# Patient Record
Sex: Male | Born: 1975 | Race: White | Hispanic: No | Marital: Single | Smoking: Current every day smoker
Health system: Southern US, Community
[De-identification: ages and names within clinical notes are randomized; demographics above are authoritative.]

## PROBLEM LIST (undated history)

## (undated) DIAGNOSIS — T7840XA Allergy, unspecified, initial encounter: Secondary | ICD-10-CM

## (undated) HISTORY — PX: SHOULDER ARTHROSCOPY: SHX128

## (undated) HISTORY — DX: Allergy, unspecified, initial encounter: T78.40XA

## (undated) HISTORY — PX: BACK SURGERY: SHX140

## (undated) HISTORY — PX: KNEE SURGERY: SHX244

---

## 2006-03-02 ENCOUNTER — Ambulatory Visit: Payer: Self-pay

## 2007-07-11 ENCOUNTER — Ambulatory Visit: Payer: Self-pay | Admitting: Internal Medicine

## 2007-09-12 ENCOUNTER — Ambulatory Visit: Payer: Self-pay | Admitting: Internal Medicine

## 2008-05-19 ENCOUNTER — Ambulatory Visit: Payer: Self-pay | Admitting: Internal Medicine

## 2009-08-03 ENCOUNTER — Ambulatory Visit: Payer: Self-pay | Admitting: Unknown Physician Specialty

## 2010-03-29 ENCOUNTER — Ambulatory Visit: Payer: Self-pay

## 2010-05-05 ENCOUNTER — Ambulatory Visit: Payer: Self-pay | Admitting: Unknown Physician Specialty

## 2011-08-09 ENCOUNTER — Ambulatory Visit: Payer: Self-pay | Admitting: Unknown Physician Specialty

## 2013-02-20 ENCOUNTER — Ambulatory Visit: Payer: Self-pay | Admitting: Unknown Physician Specialty

## 2013-04-17 ENCOUNTER — Emergency Department: Payer: Self-pay

## 2014-08-28 DIAGNOSIS — G8929 Other chronic pain: Secondary | ICD-10-CM | POA: Insufficient documentation

## 2014-09-17 ENCOUNTER — Emergency Department: Payer: Self-pay | Admitting: Emergency Medicine

## 2014-09-28 ENCOUNTER — Emergency Department: Payer: Self-pay | Admitting: Internal Medicine

## 2015-02-12 IMAGING — CR LEFT INDEX FINGER 2+V
1 series · 3 of 3 positions shown · non-contrast
Comparison: None.

CLINICAL DATA: Initial valuation for acute trauma.

EXAM:
LEFT INDEX FINGER 2+V

[Series 1: dxr finger index 2nd digit lt ha · 0.14mm/px · 3 of 3 slices shown]
[im 1/3]
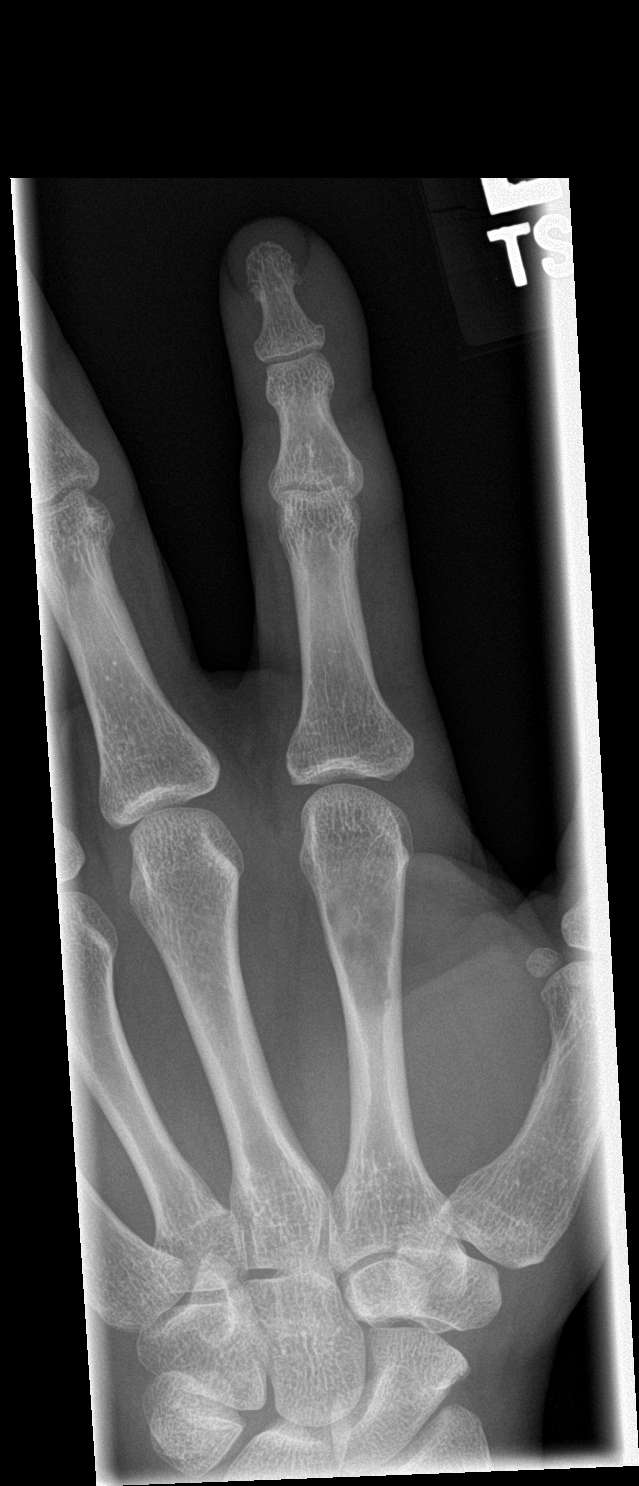
[im 2/3]
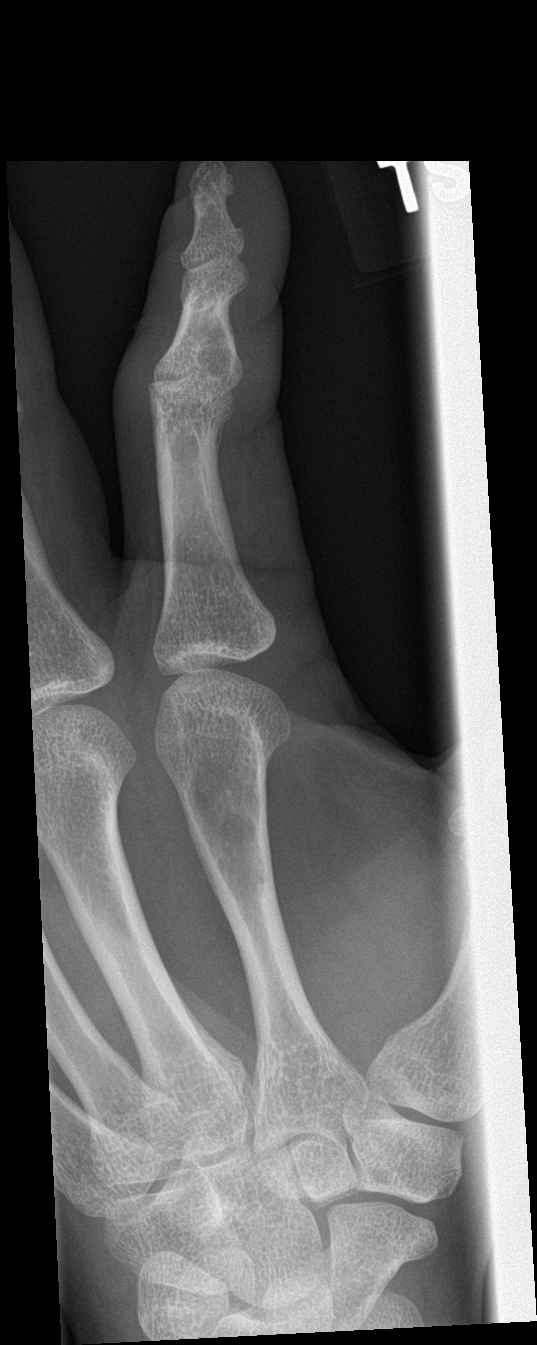
[im 3/3]
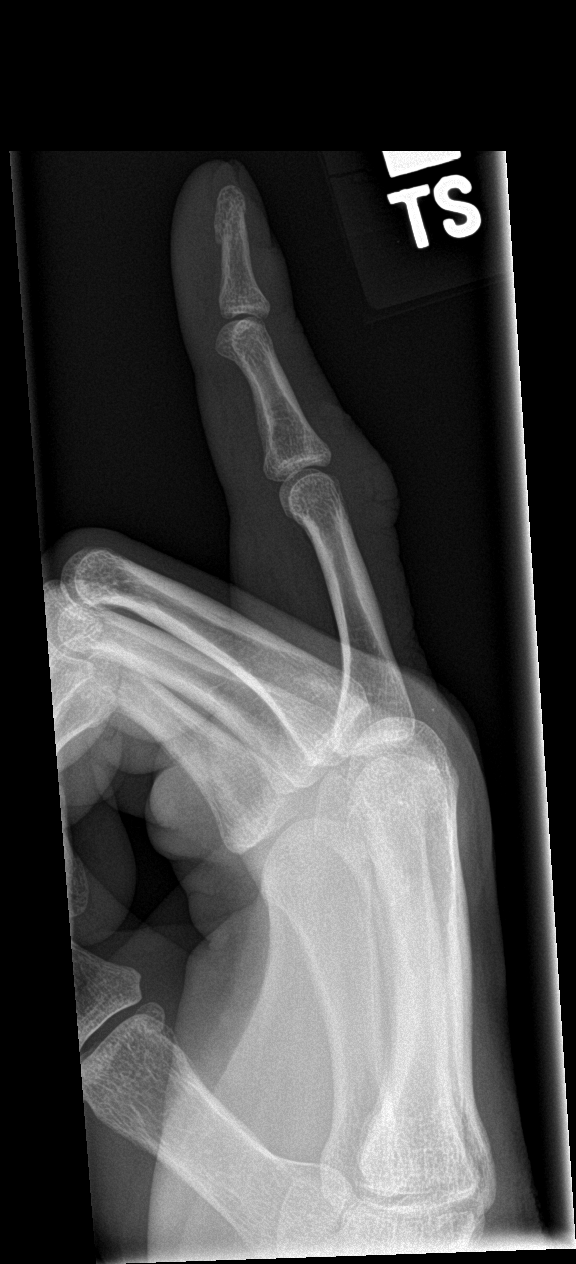

[3 of 3 positions shown; findings below may reference images not displayed]

FINDINGS: No acute fracture or dislocation. Joint spaces are maintained. No
evidence of significant degenerative or erosive arthropathy. Focal
soft tissue irregularity at the dorsal aspect of the mid digit bases
S of of laceration. No radiopaque foreign body. No dissecting soft
tissue emphysema.
IMPRESSION: 1. Soft tissue irregularity at the mid aspect of the left second
digit, suspicious for laceration. No radiopaque foreign body.
2. No acute fracture or dislocation.

## 2015-04-21 DIAGNOSIS — M19111 Post-traumatic osteoarthritis, right shoulder: Secondary | ICD-10-CM | POA: Insufficient documentation

## 2016-03-19 ENCOUNTER — Encounter: Payer: Self-pay | Admitting: Urgent Care

## 2016-03-19 DIAGNOSIS — S61411A Laceration without foreign body of right hand, initial encounter: Secondary | ICD-10-CM | POA: Insufficient documentation

## 2016-03-19 DIAGNOSIS — F17200 Nicotine dependence, unspecified, uncomplicated: Secondary | ICD-10-CM | POA: Insufficient documentation

## 2016-03-19 DIAGNOSIS — Y999 Unspecified external cause status: Secondary | ICD-10-CM | POA: Insufficient documentation

## 2016-03-19 DIAGNOSIS — Y929 Unspecified place or not applicable: Secondary | ICD-10-CM | POA: Insufficient documentation

## 2016-03-19 DIAGNOSIS — Y939 Activity, unspecified: Secondary | ICD-10-CM | POA: Insufficient documentation

## 2016-03-19 DIAGNOSIS — W25XXXA Contact with sharp glass, initial encounter: Secondary | ICD-10-CM | POA: Insufficient documentation

## 2016-03-19 NOTE — ED Notes (Signed)
Patient presents with laceration in between 3rd and 4th digit on RIGHT hand; knuckle area. Patient reports that he cut hand on an "old window"; states, "The glass just went through the glove". Bleeding controlled. Wound re-dressed in triage.

## 2016-03-20 ENCOUNTER — Emergency Department
Admission: EM | Admit: 2016-03-20 | Discharge: 2016-03-20 | Disposition: A | Discharge status: Home / Self Care | Payer: Self-pay | Attending: Emergency Medicine | Admitting: Emergency Medicine

## 2016-03-20 DIAGNOSIS — IMO0002 Reserved for concepts with insufficient information to code with codable children: Secondary | ICD-10-CM

## 2016-03-20 NOTE — ED Notes (Signed)
Patient states that he cut his hand on a broken window. Patient with small laceration between third and fourth finger of right hand. Bleeding controlled at this time.

## 2016-03-20 NOTE — ED Provider Notes (Signed)
[facility] Emergency Department Provider Note    ____________________________________________  Time seen: ~0130  I have reviewed the triage vital signs and the nursing notes.   HISTORY  Chief Complaint Laceration   History limited by: Not Limited   HPI Ruben Turner is a 40 y.o. male who presents to the emergency department today because of concerns for laceration. The patient states that he was cleaning and attic out. Part of this was moving old windows. He states he went to knock some glass out and a piece of glass cut his right hand. He was wearing a glove at the time. He denies any other injuries. He states his last tetanus shot was within the past 5 years.   History reviewed. No pertinent past medical history.  There are no active problems to display for this patient.   Past Surgical History  Procedure Laterality Date  . Knee surgery Left   . Shoulder arthroscopy Right     No current outpatient prescriptions on file.  Allergies Erythromycin and Penicillins  No family history on file.  Social History Social History  Substance Use Topics  . Smoking status: Current Every Day Smoker  . Smokeless tobacco: None  . Alcohol Use: No    Review of Systems  Constitutional: Negative for fever. Cardiovascular: Negative for chest pain. Respiratory: Negative for shortness of breath. Gastrointestinal: Negative for abdominal pain, vomiting and diarrhea. Skin: Positive for laceration Neurological: Negative for headaches, focal weakness or numbness.  10-point ROS otherwise negative.  ____________________________________________   PHYSICAL EXAM:  VITAL SIGNS: ED Triage Vitals  Enc Vitals Group     BP 03/19/16 2347 133/88 mmHg     Pulse Rate 03/19/16 2347 79     Resp 03/19/16 2347 18     Temp 03/19/16 2347 98.7 F (37.1 C)     Temp Source 03/19/16 2347 Oral     SpO2 03/19/16 2347 99 %     Weight --      Height --      Head  Cir --      Peak Flow --      Pain Score 03/19/16 2348 5   Constitutional: Alert and oriented. Well appearing and in no distress. Eyes: Conjunctivae are normal. PERRL. Normal extraocular movements. ENT   Head: Normocephalic and atraumatic.   Nose: No congestion/rhinnorhea.   Mouth/Throat: Mucous membranes are moist.   Neck: No stridor. Cardiovascular: Normal rate, regular rhythm.  No murmurs, rubs, or gallops. Respiratory: Normal respiratory effort without tachypnea nor retractions. Breath sounds are clear and equal bilaterally. No wheezes/rales/rhonchi. Gastrointestinal: Soft and nontender. No distention.  Genitourinary: Deferred Musculoskeletal: Normal range of motion in all extremities. No joint effusions.  Neurologic:  Normal speech and language. No gross focal neurologic deficits are appreciated.  Skin:  Roughly 1 cm laceration between the 3rd and 4th digit knuckles, hemostatic. No foreign bodies.  Psychiatric: Mood and affect are normal. Speech and behavior are normal. Patient exhibits appropriate insight and judgment.  ____________________________________________    LABS (pertinent positives/negatives)  None  ____________________________________________   EKG  None  ____________________________________________    RADIOLOGY  None  ____________________________________________   PROCEDURES  Procedure(s) performed: Laceration repair, see procedure note(s).  Critical Care performed: No  LACERATION REPAIR Performed by: Mable Fill, PA student Authorized and supervized by: Phineas Semen Consent: Verbal consent obtained. Risks and benefits: risks, benefits and alternatives were discussed Consent given by: patient Patient identity confirmed: provided demographic data Prepped and Draped in normal sterile  fashion Wound explored  Laceration Location: right hand  Laceration Length: 1 cm  No Foreign Bodies seen or palpated  Irrigation  method: syringe Amount of cleaning: standard  Skin closure: dermabond  Patient tolerance: Patient tolerated the procedure well with no immediate complications.  ____________________________________________   INITIAL IMPRESSION / ASSESSMENT AND PLAN / ED COURSE  Pertinent labs & imaging results that were available during my care of the patient were reviewed by me and considered in my medical decision making (see chart for details).  Patient presented to the emergency department today because of concern for a laceration to his right hand. On exam he had a roughly 1 cm laceration that was hemostatic. The laceration was closed with Dermabond. Patient's tetanus is up-to-date.  ____________________________________________   FINAL CLINICAL IMPRESSION(S) / ED DIAGNOSES  Final diagnoses:  Laceration     Phineas SemenGraydon Carmeline Kowal, MD 03/20/16 [phone]

## 2016-03-20 NOTE — Discharge Instructions (Signed)
Please seek medical attention for any high fevers, chest pain, shortness of breath, change in behavior, persistent vomiting, bloody stool or any other new or concerning symptoms.  Tissue Adhesive Wound Care Some cuts and wounds can be closed with tissue adhesive. Adhesive is like glue. It holds the skin together and helps a wound heal faster. This adhesive goes away on its own as the wound heals.  HOME CARE   Showers are allowed. Do not soak the wound in water. Do not take baths, swim, or use hot tubs. Do not use soaps or creams on your wound.  If a bandage (dressing) was put on, change it as often as told by your doctor.  Keep the bandage dry.  Do not scratch, pick, or rub the adhesive.  Do not put tape over the adhesive. The adhesive could come off.  Protect the wound from another injury.  Protect the wound from sun and tanning beds.  Only take medicine as told by your doctor.  Keep all doctor visits as told. GET HELP RIGHT AWAY IF:   Your wound is red, puffy (swollen), hot, or tender.  You get a rash after the glue is put on.  You have more pain in the wound.  You have a red streak going away from the wound.  You have yellowish-white fluid (pus) coming from the wound.  You have more bleeding.  You have a fever.  You have chills and start to shake.  You notice a bad smell coming from the wound.  Your wound or adhesive breaks open. MAKE SURE YOU:   Understand these instructions.  Will watch your condition.  Will get help right away if you are not doing well or get worse.   This information is not intended to replace advice given to you by your health care provider. Make sure you discuss any questions you have with your health care provider.   Document Released: 08/30/2008 Document Revised: 09/11/2013 Document Reviewed: 06/12/2013 Elsevier Interactive Patient Education [employer]2016 [employer]vier Inc.

## 2018-03-20 ENCOUNTER — Other Ambulatory Visit: Payer: Self-pay

## 2018-03-20 ENCOUNTER — Encounter: Payer: Self-pay | Admitting: Emergency Medicine

## 2018-03-20 ENCOUNTER — Ambulatory Visit
Admission: EM | Admit: 2018-03-20 | Discharge: 2018-03-20 | Disposition: A | Discharge status: Home / Self Care | Payer: Self-pay | Attending: Family Medicine | Admitting: Family Medicine

## 2018-03-20 DIAGNOSIS — L02419 Cutaneous abscess of limb, unspecified: Secondary | ICD-10-CM

## 2018-03-20 DIAGNOSIS — L02411 Cutaneous abscess of right axilla: Secondary | ICD-10-CM

## 2018-03-20 MED ORDER — SULFAMETHOXAZOLE-TRIMETHOPRIM 800-160 MG PO TABS: 1.0000 | ORAL_TABLET | Freq: Two times a day (BID) | ORAL | 0 refills | Status: DC

## 2018-03-20 NOTE — ED Triage Notes (Signed)
Patient states that he has an insect bite in right arm pit a week ago.  Patient c/o swelling redness and pain at the site.  Patient denies fevers.

## 2018-03-20 NOTE — ED Provider Notes (Signed)
[facility]    CSN: [id number] Arrival date & time: 03/20/18  1528     History   Chief Complaint Chief Complaint  Patient presents with  . Insect Bite    HPI Ruben Turner is a 42 y.o. male.   42 yo male with a red, tender, warm lump on the skin of his right axilla for one week, progressively worsening. States he thinks it may have started as an insect bite. Denies any drainage, fevers, chills.   The history is provided by the patient.    History reviewed. No pertinent past medical history.  There are no active problems to display for this patient.   Past Surgical History:  Procedure Laterality Date  . KNEE SURGERY Left   . SHOULDER ARTHROSCOPY Right        Home Medications    Prior to Admission medications   Medication Sig Start Date End Date Taking? Authorizing Provider  sulfamethoxazole-trimethoprim (BACTRIM DS,SEPTRA DS) 800-160 MG tablet Take 1 tablet by mouth 2 (two) times daily. 03/20/18   Payton Mccallum, MD    Family History History reviewed. No pertinent family history.  Social History Social History   Tobacco Use  . Smoking status: Current Every Day Smoker    Types: Cigarettes  . Smokeless tobacco: Never Used  Substance Use Topics  . Alcohol use: No  . Drug use: Not on file     Allergies   Erythromycin and Penicillins   Review of Systems Review of Systems   Physical Exam Triage Vital Signs ED Triage Vitals  Enc Vitals Group     BP 03/20/18 1550 116/82     Pulse Rate 03/20/18 1550 76     Resp 03/20/18 1550 16     Temp 03/20/18 1550 98.4 F (36.9 C)     Temp Source 03/20/18 1550 Oral     SpO2 03/20/18 1550 99 %     Weight 03/20/18 1547 170 lb (77.1 kg)     Height 03/20/18 1547 5\' 11"  (1.803 m)     Head Circumference --      Peak Flow --      Pain Score 03/20/18 1547 8     Pain Loc --      Pain Edu? --      Excl. in GC? --    No data found.  Updated Vital Signs BP 116/82 (BP Location: Left Arm)    Pulse 76   Temp 98.4 F (36.9 C) (Oral)   Resp 16   Ht 5\' 11"  (1.803 m)   Wt 170 lb (77.1 kg)   SpO2 99%   BMI 23.71 kg/m   Visual Acuity Right Eye Distance:   Left Eye Distance:   Bilateral Distance:    Right Eye Near:   Left Eye Near:    Bilateral Near:     Physical Exam  Constitutional: He appears well-developed and well-nourished. No distress.  Skin: He is not diaphoretic.  3x4cm fluctuant, tender, erythematous and warm subcutaneous lesion on right axillary skin  Nursing note and vitals reviewed.    UC Treatments / Results  Labs (all labs ordered are listed, but only abnormal results are displayed) Labs Reviewed - No data to display  EKG None Radiology No results found.  Procedures Incision and Drainage Date/Time: 03/20/2018 4:48 PM Performed by: Payton Mccallum, MD Authorized by: Payton Mccallum, MD   Consent:    Consent obtained:  Verbal   Consent given by:  Patient   Risks discussed:  Damage to other organs, incomplete drainage, bleeding, pain and infection   Alternatives discussed:  No treatment Location:    Type:  Abscess   Size:  3x4cm   Location:  Upper extremity   Upper extremity location: right axilla. Pre-procedure details:    Skin preparation:  Betadine Anesthesia (see MAR for exact dosages):    Anesthesia method:  Topical application   Topical anesthetic:  LET Procedure type:    Complexity:  Simple Procedure details:    Needle aspiration: no     Incision types:  Stab incision   Incision depth:  Subcutaneous   Scalpel blade:  11   Drainage:  Purulent   Drainage amount:  Scant   Wound treatment:  Wound left open   Packing materials:  None Post-procedure details:    Patient tolerance of procedure:  Tolerated well, no immediate complications   (including critical care time)  Medications Ordered in UC Medications - No data to display   Initial Impression / Assessment and Plan / UC Course  I have reviewed the triage vital signs and  the nursing notes.  Pertinent labs & imaging results that were available during my care of the patient were reviewed by me and considered in my medical decision making (see chart for details).       Final Clinical Impressions(s) / UC Diagnoses   Final diagnoses:  Axillary abscess    ED Discharge Orders        Ordered    sulfamethoxazole-trimethoprim (BACTRIM DS,SEPTRA DS) 800-160 MG tablet  2 times daily     03/20/18 1638     1. diagnosis reviewed with patient 2. Procedure as per note above 3.  rx as per orders above; reviewed possible side effects, interactions, risks and benefits  4. Recommend supportive treatment with warm compresses to area; otc analgesics prn  5. Follow-up prn if symptoms worsen or don't improve  Controlled Substance Prescriptions [place] Controlled Substance Registry consulted? Not Applicable   Payton Mccallumonty, Flora Ratz, MD 03/20/18 [phone]

## 2018-03-20 NOTE — Discharge Instructions (Signed)
Warm compresses to area °

## 2021-12-30 ENCOUNTER — Encounter: Payer: Self-pay | Admitting: Emergency Medicine

## 2021-12-30 ENCOUNTER — Ambulatory Visit
Admission: EM | Admit: 2021-12-30 | Discharge: 2021-12-30 | Disposition: A | Discharge status: Home / Self Care | Payer: Self-pay | Attending: Internal Medicine | Admitting: Internal Medicine

## 2021-12-30 ENCOUNTER — Other Ambulatory Visit: Payer: Self-pay

## 2021-12-30 DIAGNOSIS — J029 Acute pharyngitis, unspecified: Secondary | ICD-10-CM

## 2021-12-30 DIAGNOSIS — Z20822 Contact with and (suspected) exposure to covid-19: Secondary | ICD-10-CM | POA: Insufficient documentation

## 2021-12-30 DIAGNOSIS — F17210 Nicotine dependence, cigarettes, uncomplicated: Secondary | ICD-10-CM | POA: Insufficient documentation

## 2021-12-30 LAB — GROUP A STREP BY PCR: Group A Strep by PCR: NOT DETECTED

## 2021-12-30 NOTE — ED Provider Notes (Signed)
[facility]    CSN: [id number] Arrival date & time: 12/30/21  1529      History   Chief Complaint Chief Complaint  Patient presents with   Sore Throat    HPI Ruben Turner is a 46 y.o. male.   Patient presents with sore throat for 2 days.  Painful to swallow but able to tolerate food and liquids.  No known sick contacts.  Has not attempted treatment of symptoms.  No pertinent medical history.  Denies fever, chills, body aches, nasal congestion, rhinorrhea, shortness of breath, wheezing, coughing, abdominal pain, nausea, vomiting, diarrhea, ear pain or headaches.  History reviewed. No pertinent past medical history.  There are no problems to display for this patient.   Past Surgical History:  Procedure Laterality Date   KNEE SURGERY Left    SHOULDER ARTHROSCOPY Right        Home Medications    Prior to Admission medications   Medication Sig Start Date End Date Taking? Authorizing Provider  sulfamethoxazole-trimethoprim (BACTRIM DS,SEPTRA DS) 800-160 MG tablet Take 1 tablet by mouth 2 (two) times daily. 03/20/18   Payton Mccallum, MD    Family History No family history on file.  Social History Social History   Tobacco Use   Smoking status: Every Day    Types: Cigarettes   Smokeless tobacco: Never  Vaping Use   Vaping Use: Never used  Substance Use Topics   Alcohol use: No     Allergies   Erythromycin and Penicillins   Review of Systems Review of Systems  Constitutional: Negative.   HENT:  Positive for sore throat. Negative for congestion, dental problem, drooling, ear discharge, ear pain, facial swelling, hearing loss, mouth sores, nosebleeds, postnasal drip, rhinorrhea, sinus pressure, sinus pain, sneezing, tinnitus, trouble swallowing and voice change.   Respiratory: Negative.    Cardiovascular: Negative.   Gastrointestinal: Negative.   Skin: Negative.   Neurological: Negative.     Physical Exam Triage Vital Signs ED Triage  Vitals  Enc Vitals Group     BP 12/30/21 1541 115/67     Pulse Rate 12/30/21 1541 (!) 50     Resp 12/30/21 1541 18     Temp 12/30/21 1541 97.9 F (36.6 C)     Temp Source 12/30/21 1541 Oral     SpO2 12/30/21 1541 100 %     Weight 12/30/21 1539 169 lb 15.6 oz (77.1 kg)     Height 12/30/21 1539 5\' 11"  (1.803 m)     Head Circumference --      Peak Flow --      Pain Score 12/30/21 1538 3     Pain Loc --      Pain Edu? --      Excl. in GC? --    No data found.  Updated Vital Signs BP 115/67 (BP Location: Left Arm)    Pulse (!) 50    Temp 97.9 F (36.6 C) (Oral)    Resp 18    Ht 5\' 11"  (1.803 m)    Wt 169 lb 15.6 oz (77.1 kg)    SpO2 100%    BMI 23.71 kg/m   Visual Acuity Right Eye Distance:   Left Eye Distance:   Bilateral Distance:    Right Eye Near:   Left Eye Near:    Bilateral Near:     Physical Exam Constitutional:      Appearance: He is well-developed and normal weight.  HENT:     Head: Normocephalic.  Right Ear: Tympanic membrane and ear canal normal.     Left Ear: Tympanic membrane and ear canal normal.     Nose: No congestion or rhinorrhea.     Mouth/Throat:     Mouth: Mucous membranes are moist.     Pharynx: Posterior oropharyngeal erythema present.     Tonsils: No tonsillar exudate or tonsillar abscesses. 0 on the right. 0 on the left.  Cardiovascular:     Rate and Rhythm: Normal rate and regular rhythm.     Heart sounds: Normal heart sounds.  Pulmonary:     Effort: Pulmonary effort is normal.     Breath sounds: Normal breath sounds.  Musculoskeletal:     Cervical back: Neck supple.  Skin:    General: Skin is warm and dry.  Neurological:     General: No focal deficit present.     Mental Status: He is alert and oriented to person, place, and time.  Psychiatric:        Mood and Affect: Mood normal.        Behavior: Behavior normal.     UC Treatments / Results  Labs (all labs ordered are listed, but only abnormal results are displayed) Labs  Reviewed  GROUP A STREP BY PCR    EKG   Radiology No results found.  Procedures Procedures (including critical care time)  Medications Ordered in UC Medications - No data to display  Initial Impression / Assessment and Plan / UC Course  I have reviewed the triage vital signs and the nursing notes.  Pertinent labs & imaging results that were available during my care of the patient were reviewed by me and considered in my medical decision making (see chart for details).  Sore throat  Strep PCR negative, COVID test pending, discussed findings with patient, etiology of symptoms most likely viral, recommended over-the-counter Tylenol or ibuprofen for pain management, may attempt salt water gargles, Listerine gargles, throat lozenges, warm liquids and honey in addition for management, urgent care follow-up as needed Final Clinical Impressions(s) / UC Diagnoses   Final diagnoses:  None   Discharge Instructions   None    ED Prescriptions   None    PDMP not reviewed this encounter.   Valinda Hoar, NP 12/30/21 1624

## 2021-12-30 NOTE — ED Triage Notes (Signed)
Pt c/o sore throat. Started about 2 days ago. Denies other URI symptoms or fever.

## 2021-12-30 NOTE — Discharge Instructions (Signed)
Your strep PCR test was negative for bacteria therefore we must treat this as a viral symptom meaning it will work its way through your system with time and resolve on its own  Your COVID test is pending, you will be called if positive  You may attempt use of salt water gargles, gargling Listerine, throat lozenges, increasing fluid intake, warm teas or liquids and honey for comfort  You may use 600 to 800 mg of ibuprofen every 6-8 hours and/or Tylenol 500 to 1000 mg every 6 hours for pain management  If symptoms do not improve you may follow-up with urgent care as needed

## 2021-12-31 LAB — SARS CORONAVIRUS 2 (TAT 6-24 HRS): SARS Coronavirus 2: NEGATIVE

## 2023-12-18 ENCOUNTER — Emergency Department
Admission: EM | Admit: 2023-12-18 | Discharge: 2023-12-18 | Disposition: A | Discharge status: Home / Self Care | Payer: MEDICAID | Attending: Emergency Medicine | Admitting: Emergency Medicine

## 2023-12-18 ENCOUNTER — Other Ambulatory Visit: Payer: Self-pay

## 2023-12-18 DIAGNOSIS — M546 Pain in thoracic spine: Secondary | ICD-10-CM | POA: Insufficient documentation

## 2023-12-18 DIAGNOSIS — M549 Dorsalgia, unspecified: Secondary | ICD-10-CM | POA: Diagnosis present

## 2023-12-18 LAB — URINALYSIS, ROUTINE W REFLEX MICROSCOPIC
Bilirubin Urine: NEGATIVE
Glucose, UA: NEGATIVE mg/dL
Hgb urine dipstick: NEGATIVE
Ketones, ur: NEGATIVE mg/dL
Leukocytes,Ua: NEGATIVE
Nitrite: NEGATIVE
Protein, ur: NEGATIVE mg/dL
Specific Gravity, Urine: 1.014 (ref 1.005–1.030)
pH: 5 (ref 5.0–8.0)

## 2023-12-18 LAB — COMPREHENSIVE METABOLIC PANEL
ALT: 19 U/L (ref 0–44)
AST: 23 U/L (ref 15–41)
Albumin: 3.3 g/dL — ABNORMAL LOW (ref 3.5–5.0)
Alkaline Phosphatase: 62 U/L (ref 38–126)
Anion gap: 11 (ref 5–15)
BUN: 29 mg/dL — ABNORMAL HIGH (ref 6–20)
CO2: 23 mmol/L (ref 22–32)
Calcium: 8.7 mg/dL — ABNORMAL LOW (ref 8.9–10.3)
Chloride: 102 mmol/L (ref 98–111)
Creatinine, Ser: 0.84 mg/dL (ref 0.61–1.24)
GFR, Estimated: >60 mL/min (ref >60)
Glucose, Bld: 114 mg/dL — ABNORMAL HIGH (ref 70–99)
Potassium: 3.8 mmol/L (ref 3.5–5.1)
Sodium: 136 mmol/L (ref 135–145)
Total Bilirubin: 0.7 mg/dL (ref 0.0–1.2)
Total Protein: 7.4 g/dL (ref 6.5–8.1)

## 2023-12-18 LAB — CBC
HCT: 32.5 % — ABNORMAL LOW (ref 39.0–52.0)
Hemoglobin: 10.4 g/dL — ABNORMAL LOW (ref 13.0–17.0)
MCH: 27.7 pg (ref 26.0–34.0)
MCHC: 32 g/dL (ref 30.0–36.0)
MCV: 86.7 fL (ref 80.0–100.0)
Platelets: 289 10*3/uL (ref 150–400)
RBC: 3.75 MIL/uL — ABNORMAL LOW (ref 4.22–5.81)
RDW: 15.5 % (ref 11.5–15.5)
WBC: 9.3 10*3/uL (ref 4.0–10.5)
nRBC: 0 % (ref 0.0–0.2)

## 2023-12-18 LAB — LIPASE, BLOOD: Lipase: 22 U/L (ref 11–51)

## 2023-12-18 MED ORDER — LIDOCAINE 5 % EX PTCH
1.0000 | MEDICATED_PATCH | Freq: Once | CUTANEOUS | Status: DC
  Administered 2023-12-18: 1 via TRANSDERMAL
  Filled 2023-12-18: qty 1

## 2023-12-18 MED ORDER — KETOROLAC TROMETHAMINE 15 MG/ML IJ SOLN
15.0000 mg | Freq: Once | INTRAMUSCULAR | Status: AC
  Administered 2023-12-18: 15 mg via INTRAMUSCULAR
  Filled 2023-12-18: qty 1

## 2023-12-18 MED ORDER — DIAZEPAM 5 MG PO TABS
10.0000 mg | ORAL_TABLET | Freq: Once | ORAL | Status: AC
  Administered 2023-12-18: 10 mg via ORAL
  Filled 2023-12-18: qty 2

## 2023-12-18 MED ORDER — CYCLOBENZAPRINE HCL 10 MG PO TABS: 10.0000 mg | ORAL_TABLET | Freq: Three times a day (TID) | ORAL | 0 refills | Status: DC | PRN

## 2023-12-18 MED ORDER — LIDOCAINE 4 % EX PTCH: 1.0000 | MEDICATED_PATCH | CUTANEOUS | 0 refills | Status: DC

## 2023-12-18 NOTE — ED Provider Notes (Signed)
 [facility] Provider Note    Event Date/Time   First MD Initiated Contact with Patient 12/18/23 [phone]     (approximate)   History   Back Pain   HPI  MAT STUARD is a 48 year old male presenting to the emergency department for evaluation of back pain.  A few months ago patient was lifting a tire when he feels like he strained his back.  Has had some ongoing back pain since that time, but that got worse a few weeks ago.  Sometimes feels like the pain radiates around his ribs.  No nausea or vomiting.  No dysuria or urinary frequency.  Has been taking Tylenol  and ibuprofen with limited improvement.  No numbness, tingling, focal weakness, bowel or bladder symptoms, fevers.     Physical Exam   Triage Vital Signs: ED Triage Vitals  Encounter Vitals Group     BP 12/18/23 0915 (!) 140/76     Systolic BP Percentile --      Diastolic BP Percentile --      Pulse Rate 12/18/23 0915 91     Resp 12/18/23 0915 16     Temp 12/18/23 0915 98.3 F (36.8 C)     Temp Source 12/18/23 0915 Oral     SpO2 12/18/23 0915 100 %     Weight 12/18/23 0920 169 lb 15.6 oz (77.1 kg)     Height 12/18/23 0920 5' 11 (1.803 m)     Head Circumference --      Peak Flow --      Pain Score 12/18/23 0919 7     Pain Loc --      Pain Education --      Exclude from Growth Chart --     Most recent vital signs: Vitals:   12/18/23 0915  BP: (!) 140/76  Pulse: 91  Resp: 16  Temp: 98.3 F (36.8 C)  SpO2: 100%     General: Awake, interactive  CV:  Regular rate, good peripheral perfusion.  Resp:  Unlabored respirations. Abd:  Nondistended, soft, nontender Neuro:  Symmetric facial movement, fluid speech, 5/5 strength in the bilateral upper and lower extremities Back:  Small, soft mobile mass over the left mid back consistent with a lipoma without overlying erythema, warmth, there is tenderness throughout the thoracic back including over the spine, remainder of spine  nontender  ED Results / Procedures / Treatments   Labs (all labs ordered are listed, but only abnormal results are displayed) Labs Reviewed  COMPREHENSIVE METABOLIC PANEL - Abnormal; Notable for the following components:      Result Value   Glucose, Bld 114 (*)    BUN 29 (*)    Calcium 8.7 (*)    Albumin 3.3 (*)    All other components within normal limits  CBC - Abnormal; Notable for the following components:   RBC 3.75 (*)    Hemoglobin 10.4 (*)    HCT 32.5 (*)    All other components within normal limits  URINALYSIS, ROUTINE W REFLEX MICROSCOPIC - Abnormal; Notable for the following components:   Color, Urine YELLOW (*)    APPearance CLEAR (*)    All other components within normal limits  LIPASE, BLOOD     EKG EKG independently reviewed interpreted by myself (ER attending) demonstrates:    RADIOLOGY Imaging independently reviewed and interpreted by myself demonstrates:    PROCEDURES:  Critical Care performed: No  Procedures   MEDICATIONS ORDERED IN ED: Medications  lidocaine  (LIDODERM ) 5 %  1 patch (1 patch Transdermal Patch Applied 12/18/23 1001)  ketorolac  (TORADOL ) 15 MG/ML injection 15 mg (15 mg Intramuscular Given 12/18/23 1000)  diazepam  (VALIUM ) tablet 10 mg (10 mg Oral Given 12/18/23 0959)     IMPRESSION / MDM / ASSESSMENT AND PLAN / ED COURSE  I reviewed the triage vital signs and the nursing notes.  Differential diagnosis includes, but is not limited to, musculoskeletal strain, lower suspicion for fracture given duration of symptoms and physical exam, clinical history and exam not consistent with acute spinal cord pathology, consideration for UTI, low suspicion intra-abdominal pathology  Patient's presentation is most consistent with acute complicated illness / injury requiring diagnostic workup.  48 year old male presenting to the emergency department for evaluation of thoracic back pain.  Vital stable on presentation.  Labs are sent from triage  which are without critical derangements.  Patient is noted to be anemic, but no prior for comparison, and no reported acute bleeding sources.  Urine without evidence of infection.  Offered x-Shelbi Vaccaro, but patient declined which I do think is reasonable with low clinical suspicion for fracture.  Patient was treated with multimodal pain control with Toradol , Valium , lidocaine  patch.  He felt much improved on reevaluation.  He is comfortable discharge home.  Will DC with prescription for Flexeril .  Strict return precautions provided.  Patient discharged in stable condition.    FINAL CLINICAL IMPRESSION(S) / ED DIAGNOSES   Final diagnoses:  Midline thoracic back pain, unspecified chronicity     Rx / DC Orders   ED Discharge Orders          Ordered    cyclobenzaprine  (FLEXERIL ) 10 MG tablet  3 times daily PRN        12/18/23 1042    lidocaine  (LIDOCAINE  PAIN RELIEF) 4 %  Every 24 hours        12/18/23 1042             Note:  This document was prepared using Dragon voice recognition software and may include unintentional dictation errors.   Levander Slate, MD 12/18/23 1043

## 2023-12-18 NOTE — Discharge Instructions (Addendum)
 You were seen in the emergency department today for evaluation of your back pain. Fortunately, your labs and exam here are reassuring. You can continue to take Tylenol  and ibuprofen as needed. We will send a prescription for a muscle relaxer to your pharmacy. Do not drive or operate machinery when taking this medication.  I also sent a prescription for lidocaine  patches.  Return to the ER for any worsening symptoms including numbness, tingling, weakness, bowel or bladder incontinence, or any other new or concerning symptoms.  I have also included information for follow-up with a spine specialist as needed if your symptoms or not improving.

## 2023-12-18 NOTE — ED Triage Notes (Signed)
 C/O mid back pain x 2 months, worsening over past 10 days. States had been helping brother move heavy tires 2 months ago and feels he injured back at that time. Occasionally pain goes around ribs and abdomen is swollen.  Denies N/V

## 2023-12-19 ENCOUNTER — Emergency Department: Payer: MEDICAID

## 2023-12-19 ENCOUNTER — Observation Stay: Payer: MEDICAID

## 2023-12-19 ENCOUNTER — Inpatient Hospital Stay
Admission: EM | Admit: 2023-12-19 | Discharge: 2023-12-23 | DRG: 478 | Disposition: A | Discharge status: Other Acute Inpatient Hospital | Payer: MEDICAID | Attending: Internal Medicine | Admitting: Internal Medicine

## 2023-12-19 ENCOUNTER — Other Ambulatory Visit: Payer: Self-pay

## 2023-12-19 DIAGNOSIS — M4624 Osteomyelitis of vertebra, thoracic region: Principal | ICD-10-CM | POA: Diagnosis present

## 2023-12-19 DIAGNOSIS — M8448XA Pathological fracture, other site, initial encounter for fracture: Secondary | ICD-10-CM | POA: Diagnosis not present

## 2023-12-19 DIAGNOSIS — M899 Disorder of bone, unspecified: Secondary | ICD-10-CM | POA: Diagnosis present

## 2023-12-19 DIAGNOSIS — M4804 Spinal stenosis, thoracic region: Secondary | ICD-10-CM

## 2023-12-19 DIAGNOSIS — A498 Other bacterial infections of unspecified site: Secondary | ICD-10-CM | POA: Diagnosis present

## 2023-12-19 DIAGNOSIS — F1190 Opioid use, unspecified, uncomplicated: Secondary | ICD-10-CM

## 2023-12-19 DIAGNOSIS — J40 Bronchitis, not specified as acute or chronic: Secondary | ICD-10-CM | POA: Diagnosis present

## 2023-12-19 DIAGNOSIS — F17210 Nicotine dependence, cigarettes, uncomplicated: Secondary | ICD-10-CM | POA: Diagnosis present

## 2023-12-19 DIAGNOSIS — M532X4 Spinal instabilities, thoracic region: Secondary | ICD-10-CM | POA: Diagnosis not present

## 2023-12-19 DIAGNOSIS — M898X9 Other specified disorders of bone, unspecified site: Secondary | ICD-10-CM | POA: Diagnosis present

## 2023-12-19 DIAGNOSIS — F1110 Opioid abuse, uncomplicated: Secondary | ICD-10-CM | POA: Diagnosis present

## 2023-12-19 DIAGNOSIS — R918 Other nonspecific abnormal finding of lung field: Secondary | ICD-10-CM | POA: Diagnosis not present

## 2023-12-19 DIAGNOSIS — Z881 Allergy status to other antibiotic agents status: Secondary | ICD-10-CM

## 2023-12-19 DIAGNOSIS — E8809 Other disorders of plasma-protein metabolism, not elsewhere classified: Secondary | ICD-10-CM | POA: Diagnosis present

## 2023-12-19 DIAGNOSIS — Z880 Allergy status to penicillin: Secondary | ICD-10-CM

## 2023-12-19 DIAGNOSIS — M4644 Discitis, unspecified, thoracic region: Secondary | ICD-10-CM | POA: Diagnosis present

## 2023-12-19 DIAGNOSIS — D649 Anemia, unspecified: Secondary | ICD-10-CM | POA: Diagnosis present

## 2023-12-19 DIAGNOSIS — G9589 Other specified diseases of spinal cord: Secondary | ICD-10-CM | POA: Diagnosis not present

## 2023-12-19 DIAGNOSIS — M40294 Other kyphosis, thoracic region: Secondary | ICD-10-CM

## 2023-12-19 DIAGNOSIS — M4620 Osteomyelitis of vertebra, site unspecified: Secondary | ICD-10-CM

## 2023-12-19 LAB — CBC WITH DIFFERENTIAL/PLATELET
Abs Immature Granulocytes: 0.03 10*3/uL (ref 0.00–0.07)
Basophils Absolute: 0 10*3/uL (ref 0.0–0.1)
Basophils Relative: 0 %
Eosinophils Absolute: 0.1 10*3/uL (ref 0.0–0.5)
Eosinophils Relative: 1 %
HCT: 31.6 % — ABNORMAL LOW (ref 39.0–52.0)
Hemoglobin: 10.1 g/dL — ABNORMAL LOW (ref 13.0–17.0)
Immature Granulocytes: 0 %
Lymphocytes Relative: 22 %
Lymphs Abs: 2.1 10*3/uL (ref 0.7–4.0)
MCH: 27.5 pg (ref 26.0–34.0)
MCHC: 32 g/dL (ref 30.0–36.0)
MCV: 86.1 fL (ref 80.0–100.0)
Monocytes Absolute: 0.6 10*3/uL (ref 0.1–1.0)
Monocytes Relative: 7 %
Neutro Abs: 6.5 10*3/uL (ref 1.7–7.7)
Neutrophils Relative %: 70 %
Platelets: 292 10*3/uL (ref 150–400)
RBC: 3.67 MIL/uL — ABNORMAL LOW (ref 4.22–5.81)
RDW: 15.3 % (ref 11.5–15.5)
WBC: 9.3 10*3/uL (ref 4.0–10.5)
nRBC: 0 % (ref 0.0–0.2)

## 2023-12-19 LAB — COMPREHENSIVE METABOLIC PANEL
ALT: 16 U/L (ref 0–44)
AST: 21 U/L (ref 15–41)
Albumin: 3.2 g/dL — ABNORMAL LOW (ref 3.5–5.0)
Alkaline Phosphatase: 65 U/L (ref 38–126)
Anion gap: 14 (ref 5–15)
BUN: 14 mg/dL (ref 6–20)
CO2: 23 mmol/L (ref 22–32)
Calcium: 8.8 mg/dL — ABNORMAL LOW (ref 8.9–10.3)
Chloride: 99 mmol/L (ref 98–111)
Creatinine, Ser: 0.71 mg/dL (ref 0.61–1.24)
GFR, Estimated: >60 mL/min (ref >60)
Glucose, Bld: 75 mg/dL (ref 70–99)
Potassium: 3.9 mmol/L (ref 3.5–5.1)
Sodium: 136 mmol/L (ref 135–145)
Total Bilirubin: 0.7 mg/dL (ref 0.0–1.2)
Total Protein: 7.1 g/dL (ref 6.5–8.1)

## 2023-12-19 LAB — SEDIMENTATION RATE: Sed Rate: 82 mm/h — ABNORMAL HIGH (ref 0–15)

## 2023-12-19 LAB — LACTIC ACID, PLASMA: Lactic Acid, Venous: 0.7 mmol/L (ref 0.5–1.9)

## 2023-12-19 LAB — C-REACTIVE PROTEIN: CRP: 7.7 mg/dL — ABNORMAL HIGH (ref <1.0)

## 2023-12-19 MED ORDER — OXYCODONE HCL 5 MG PO TABS
10.0000 mg | ORAL_TABLET | Freq: Four times a day (QID) | ORAL | Status: DC | PRN
  Administered 2023-12-19 – 2023-12-21 (×6): 10 mg via ORAL
  Filled 2023-12-19 (×6): qty 2

## 2023-12-19 MED ORDER — HYDROMORPHONE HCL 1 MG/ML IJ SOLN
1.0000 mg | Freq: Once | INTRAMUSCULAR | Status: AC
  Administered 2023-12-19: 1 mg via INTRAVENOUS
  Filled 2023-12-19: qty 1

## 2023-12-19 MED ORDER — HYDROMORPHONE HCL 1 MG/ML IJ SOLN
0.5000 mg | Freq: Once | INTRAMUSCULAR | Status: AC
Start: 2023-12-19 — End: 2023-12-19
  Administered 2023-12-19: 0.5 mg via INTRAVENOUS
  Filled 2023-12-19: qty 0.5

## 2023-12-19 MED ORDER — IOHEXOL 300 MG/ML  SOLN
100.0000 mL | Freq: Once | INTRAMUSCULAR | Status: AC | PRN
  Administered 2023-12-19: 100 mL via INTRAVENOUS

## 2023-12-19 MED ORDER — GADOBUTROL 1 MMOL/ML IV SOLN
7.0000 mL | Freq: Once | INTRAVENOUS | Status: AC | PRN
  Administered 2023-12-19: 7 mL via INTRAVENOUS

## 2023-12-19 MED ORDER — OXYCODONE HCL 5 MG PO TABS
5.0000 mg | ORAL_TABLET | Freq: Once | ORAL | Status: AC
  Administered 2023-12-19: 5 mg via ORAL
  Filled 2023-12-19: qty 1

## 2023-12-19 MED ORDER — HYDROMORPHONE HCL 1 MG/ML IJ SOLN
1.0000 mg | INTRAMUSCULAR | Status: DC | PRN
  Administered 2023-12-19: 1 mg via INTRAVENOUS
  Filled 2023-12-19: qty 1

## 2023-12-19 MED ORDER — POLYETHYLENE GLYCOL 3350 17 G PO PACK
17.0000 g | PACK | Freq: Every day | ORAL | Status: DC
  Administered 2023-12-21 – 2023-12-23 (×3): 17 g via ORAL
  Filled 2023-12-19 (×4): qty 1

## 2023-12-19 MED ORDER — TRAZODONE HCL 50 MG PO TABS
50.0000 mg | ORAL_TABLET | Freq: Every evening | ORAL | Status: DC | PRN
  Administered 2023-12-19 – 2023-12-22 (×4): 50 mg via ORAL
  Filled 2023-12-19 (×4): qty 1

## 2023-12-19 MED ORDER — MORPHINE SULFATE (PF) 4 MG/ML IV SOLN
4.0000 mg | Freq: Once | INTRAVENOUS | Status: AC
  Administered 2023-12-19: 4 mg via INTRAVENOUS
  Filled 2023-12-19: qty 1

## 2023-12-19 MED ORDER — LIDOCAINE 5 % EX PTCH
1.0000 | MEDICATED_PATCH | CUTANEOUS | Status: DC
  Administered 2023-12-20 – 2023-12-23 (×5): 1 via TRANSDERMAL
  Filled 2023-12-19 (×5): qty 1

## 2023-12-19 MED ORDER — FENTANYL CITRATE PF 50 MCG/ML IJ SOSY
50.0000 ug | PREFILLED_SYRINGE | INTRAMUSCULAR | Status: DC | PRN
  Administered 2023-12-19 – 2023-12-23 (×22): 50 ug via INTRAVENOUS
  Filled 2023-12-19 (×22): qty 1

## 2023-12-19 MED ORDER — KETOROLAC TROMETHAMINE 30 MG/ML IJ SOLN
30.0000 mg | Freq: Four times a day (QID) | INTRAMUSCULAR | Status: DC
  Administered 2023-12-19 – 2023-12-23 (×16): 30 mg via INTRAVENOUS
  Filled 2023-12-19 (×16): qty 1

## 2023-12-19 MED ORDER — SODIUM CHLORIDE 0.9% FLUSH
3.0000 mL | Freq: Two times a day (BID) | INTRAVENOUS | Status: DC
  Administered 2023-12-19 – 2023-12-23 (×9): 3 mL via INTRAVENOUS

## 2023-12-19 MED ORDER — NICOTINE POLACRILEX 2 MG MT GUM
2.0000 mg | CHEWING_GUM | OROMUCOSAL | Status: DC | PRN
  Administered 2023-12-20 (×3): 2 mg via ORAL
  Filled 2023-12-19 (×4): qty 1

## 2023-12-19 MED ORDER — ACETAMINOPHEN 500 MG PO TABS
1000.0000 mg | ORAL_TABLET | Freq: Four times a day (QID) | ORAL | Status: DC
  Administered 2023-12-19 – 2023-12-22 (×10): 1000 mg via ORAL
  Filled 2023-12-19 (×11): qty 2

## 2023-12-19 MED ORDER — FENTANYL CITRATE PF 50 MCG/ML IJ SOSY
100.0000 ug | PREFILLED_SYRINGE | Freq: Once | INTRAMUSCULAR | Status: AC
  Administered 2023-12-19: 100 ug via INTRAVENOUS
  Filled 2023-12-19: qty 2

## 2023-12-19 MED ORDER — KETOROLAC TROMETHAMINE 30 MG/ML IJ SOLN
30.0000 mg | Freq: Once | INTRAMUSCULAR | Status: AC
  Administered 2023-12-19: 30 mg via INTRAMUSCULAR
  Filled 2023-12-19: qty 1

## 2023-12-19 NOTE — ED Provider Notes (Signed)
 [facility] Provider Note    Event Date/Time   First MD Initiated Contact with Patient 12/19/23 [phone]     (approximate)   History   Back Pain   HPI  Ruben Turner is a 48 y.o. male who comes in for back pain.  Patient was here yesterday for back pain treated with Valium , Toradol , lidocaine  patch.  He was discharged on lidocaine  patches, Flexeril .  Blood work done yesterday shows a hemoglobin of 10.4 normal lipase reassuring CMP urine with was negative.  Patient reports continued pain.  He reports it is severe.  Not better with medications he was sent home with.  Physical Exam   Triage Vital Signs: ED Triage Vitals [12/19/23 0548]  Encounter Vitals Group     BP (!) 125/105     Systolic BP Percentile      Diastolic BP Percentile      Pulse Rate 100     Resp 18     Temp 98.5 F (36.9 C)     Temp Source Oral     SpO2 100 %     Weight 169 lb 15.6 oz (77.1 kg)     Height 5' 11 (1.803 m)     Head Circumference      Peak Flow      Pain Score 8     Pain Loc      Pain Education      Exclude from Growth Chart     Most recent vital signs: Vitals:   12/19/23 0548  BP: (!) 125/105  Pulse: 100  Resp: 18  Temp: 98.5 F (36.9 C)  SpO2: 100%     General: Awake, no distress.  CV:  Good peripheral perfusion.  Resp:  Normal effort.  Abd:  No distention.  Other:  Positive T-spine tenderness.  He does have a lipoma next to it. Equal strength in legs.  Sensation intact  ED Results / Procedures / Treatments   Labs (all labs ordered are listed, but only abnormal results are displayed) Labs Reviewed  CULTURE, BLOOD (ROUTINE X 2)  CULTURE, BLOOD (ROUTINE X 2)  CBC WITH DIFFERENTIAL/PLATELET  QUANTIFERON-TB GOLD PLUS  COMPREHENSIVE METABOLIC PANEL  SEDIMENTATION RATE  C-REACTIVE PROTEIN  LACTIC ACID, PLASMA  LACTIC ACID, PLASMA     RADIOLOGY I have reviewed the CT personally interpreted and positive fracture  PROCEDURES:  Critical  Care performed: No  Procedures   MEDICATIONS ORDERED IN ED: Medications  gadobutrol  (GADAVIST ) 1 MMOL/ML injection 7 mL (has no administration in time range)  oxyCODONE  (Oxy IR/ROXICODONE ) immediate release tablet 5 mg (5 mg Oral Given 12/19/23 1032)  ketorolac  (TORADOL ) 30 MG/ML injection 30 mg (30 mg Intramuscular Given 12/19/23 1033)  HYDROmorphone  (DILAUDID ) injection 0.5 mg (0.5 mg Intravenous Given 12/19/23 1242)     IMPRESSION / MDM / ASSESSMENT AND PLAN / ED COURSE  I reviewed the triage vital signs and the nursing notes.   Patient's presentation is most consistent with acute presentation with potential threat to life or bodily function.   Patient comes in with thoracic pain.  Does report a little bit of lower pain.  CT imaging ordered given patient is a bounce back patient to further evaluate for lytic lesions, fractures.  CT scan is positive.  Discussed with Dr. Katrina who thinks that this could be an infection.  Patient does report IV drug use.  He denies any risk factors for TB, night sweats, weight loss.  Does report that he used IV  drugs but is been over a year since he has been clean.  Discussed with we should hold off on antibiotics or start antibiotics.  Per neurosurgery will hold off now get MRI suspect smoldering infection.  Also order TB and place patient on precautions.  I will discuss with the hospitalist for admission for further workup patient given some IV Dilaudid  for pain.  CBC normal white count CMP, lactate reassuring  The patient is on the cardiac monitor to evaluate for evidence of arrhythmia and/or significant heart rate changes.      FINAL CLINICAL IMPRESSION(S) / ED DIAGNOSES   Final diagnoses:  Pathological fracture of thoracic vertebra, initial encounter     Rx / DC Orders   ED Discharge Orders     None        Note:  This document was prepared using Dragon voice recognition software and may include unintentional dictation errors.    Ernest Ronal BRAVO, MD 12/19/23 [phone]

## 2023-12-19 NOTE — Consult Note (Addendum)
 Consult requested by:  Dr. Arnett  Consult requested for:  T8-9 destruction  Primary Physician:  Patient, No Pcp Per  History of Present Illness: 12/19/2023 Ruben Turner is here today with a chief complaint of worsening back pain over the past several weeks to 2 months.  He had an event where he had to load a tire into his truck about 2 months ago after which he began having back pain.  He denies any neurologic symptoms other than severe pain.  He has presented to the emergency department more than once due to severe pain.  He has no additional constitutional symptoms.  He denies any fevers, chills, or unintentional weight loss.  He has no night sweats.  He has no known TB contacts.  He has history of IV drug use.  He was abstinent until recently when his pain got bad enough that he had a relapse.   Past Surgery: none  Ruben Turner has no symptoms of cervical myelopathy.  The symptoms are causing a significant impact on the patient's life.   I have utilized the care everywhere function in epic to review the outside records available from external health systems.  Review of Systems:  A 10 point review of systems is negative, except for the pertinent positives and negatives detailed in the HPI.  Past Medical History: History reviewed. No pertinent past medical history.  Past Surgical History: Past Surgical History:  Procedure Laterality Date   KNEE SURGERY Left    SHOULDER ARTHROSCOPY Right     Allergies: Allergies as of 12/19/2023 - Review Complete 12/19/2023  Allergen Reaction Noted   Erythromycin  03/19/2016   Penicillins  03/19/2016    Medications: No outpatient medications have been marked as taking for the 12/19/23 encounter [facility] Encounter).    Social History: Social History   Tobacco Use   Smoking status: Every Day    Types: Cigarettes   Smokeless tobacco: Never  Vaping Use   Vaping status: Never Used  Substance Use Topics    Alcohol use: No    Family Medical History: History reviewed. No pertinent family history.  Physical Examination: Vitals:   12/19/23 1549 12/19/23 1643  BP: (!) 146/88 (!) 125/96  Pulse: (!) 105 99  Resp: 17 20  Temp: (!) 97.5 F (36.4 C) 98.2 F (36.8 C)  SpO2: 99% 99%    General: Patient is in no apparent distress. Attention to examination is appropriate.  Neck:   Supple.  Full range of motion.  Respiratory: Patient is breathing without any difficulty.  He has an obvious kyphotic deformity in the middle of his thoracic spine.  He has a prominence of his spinous processes.  He has no skin breakdown currently.   NEUROLOGICAL:     Awake, alert, oriented to person, place, and time.  Speech is clear and fluent.  Cranial Nerves: Pupils equal round and reactive to light.  Facial tone is symmetric.  Facial sensation is symmetric. Shoulder shrug is symmetric. Tongue protrusion is midline.  There is no pronator drift.  Strength: Side Biceps Triceps Deltoid Interossei Grip Wrist Ext. Wrist Flex.  R 5 5 5 5 5 5 5   L 5 5 5 5 5 5 5    Side Iliopsoas Quads Hamstring PF DF EHL  R 5 5 5 5 5 5   L 5 5 5 5 5 5    Reflexes are 2+ and symmetric at the biceps, triceps, brachioradialis, patella and achilles.   Hoffman's is absent.  Bilateral upper and lower extremity sensation is intact to light touch.    No evidence of dysmetria noted.  Gait is untested.     Medical Decision Making  Imaging: CT CAP 12/19/2023 IMPRESSION: 1. Right infrahilar peribronchial thickening and airspace density in the right lower lobe concerning for bronchitis and pneumonia. An underlying mass is less likely but not excluded. Clinical correlation and follow-up to resolution recommended. 2. Minimally displaced fracture of the posterior right eighth rib. Nondisplaced fracture of the right T8 spinous process. 3. Destructive changes and complete collapse of T9 with posterior buckling. See reports for the  CT and MRI of the spine. 4. No acute intra-abdominal or pelvic pathology. 5. Moderate colonic stool burden. No bowel obstruction. 6.  Aortic Atherosclerosis (ICD10-I70.0).     Electronically Signed   By: Vanetta Chou M.D.   On: 12/19/2023 17:05  MRI CTL spine 12/19/2023 IMPRESSION: 1. Evaluation is somewhat limited by motion and the patient could not complete the postcontrast imaging. No post-contrast axial could be obtained on the cervical and lumbar spine. 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 No cord signal abnormality. 2. Abnormal signal and enhancement in the left third rib. Findings are concerning for additional site of metastatic disease. 3. Masslike enhancement in the medial right lung, possibly at the right hilum, concerning for a primary neoplastic process or metastatic disease. Recommend further evaluation with a CT of the chest with contrast. 4. L3-L4 mild spinal canal stenosis with moderate left and mild right neural foraminal narrowing. Narrowing of the lateral recesses at this level could affect the descending L4 nerve roots. 5. L4-L5 mild-to-moderate spinal canal stenosis and mild bilateral neural foraminal narrowing. Narrowing of the lateral recesses at this level could affect the descending L5 nerve roots. 6. L5-S1 moderate right neural foraminal narrowing. 7. C4-C5 severe left and mild right neural foraminal narrowing. 8. C5-C6 severe left and moderate right neural foraminal narrowing.     Electronically Signed   By: Donald Campion M.D.   On: 12/19/2023 14:24  I have personally reviewed the images and agree with the above interpretation.  Assessment and Plan: Mr. Ruben Turner is a pleasant 48 y.o. male with destruction of his T8 and T9 vertebral bodies anteriorly with an acute  thoracic kyphosis at that level.  There is also some destruction of the T10 vertebral body.  The Cobb angle from the top of T7 to the bottom of T10 is approximately 39 degrees.  There is spreading of the facet joints between T8 and T10.  This is indirect evidence of thoracic spinal instability.  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  It is possible that he has a pathologic fracture due to neoplastic involvement.  It is also possible that he has some form of osteomyelitis that is destroyed this area of his bone.  Either way, he will ultimately need surgical intervention as his future risk for neurologic decline is very high.  I have recommended a biopsy by IR.  If that does not show evidence of infection, we will discuss the possibility of surgical reconstruction.  The extent of surgical reconstruction required for this case would likely require a tertiary care facility.  If this is due to infection, we may be able to appropriately instrument and fix his kyphotic deformity here.  He will require a multilevel thoracolumbar fusion with  removal of the T9 posterior elements and decompression of the spinal cord at the apex of the curve.  I will order a TLSO brace for stabilization to be worn when OOB.    I have communicated my recommendations to the requesting physician and coordinated care to facilitate these recommendations.     Jaeley Wiker K. Clois MD, [facility] Neurosurgery

## 2023-12-19 NOTE — Assessment & Plan Note (Addendum)
 Patient presenting with severe back pain for 2 months found to have multiple lytic lesions involving T8-T10, concerning for metastatic disease.  - Neurosurgery consulted; appreciate their recommendations - Will discuss with IR to assess if they can obtain biopsy - Pain control with Tylenol , oxycodone , Dilaudid 

## 2023-12-19 NOTE — Progress Notes (Signed)
 1700-RN went to patient room after seeing him leave to go to the bathroom then noting bathroom door open but did not see patient return to room. Patient not in room. Patient clothing missing. Charge nurse alerted. After about 10 mins patient returned to room smelling like smoke. Patient reports he just went for walk. Explained to patient that he could not leave this area. Patient acknowledged understanding.  Provider notified later after patient returned via secure chat.

## 2023-12-19 NOTE — ED Notes (Signed)
 See triage note Presents with lower back pain  States has had pain for about 2 months w/o injury Ambulates well

## 2023-12-19 NOTE — Assessment & Plan Note (Signed)
 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  - HIV and hepatitis panel pending - Prior to discharge, will benefit from [facility] provided resources - Consider starting Suboxone prior to discharge

## 2023-12-19 NOTE — ED Triage Notes (Signed)
 Pt reports back pain x2 months. Pt states he was seen here for same yesterday given a shot of Toradol and some rx to go home with. Pt states pain is not better

## 2023-12-19 NOTE — H&P (Signed)
 History and Physical    Patient: Ruben Turner FMW:[id number] DOB: [birth year] DOA: 12/19/2023 DOS: the patient was seen and examined on 12/19/2023 PCP: Patient, No Pcp Per  Patient coming from: Home  Chief Complaint:  Chief Complaint  Patient presents with   Back Pain   HPI: Ruben Turner is a 48 y.o. male with no significant medical history who presents to the ED due to back pain.  Ruben Turner states that for the last 2 months, he has been experiencing significant mid and lower back pain that has limited his range of motion. Otherwise he denies any fever, chills, unintentional weight loss, night sweats.  He has noticed that his back pain significantly worsened in the last 24 hours. He did have 1 episode of rigors but believes it may have been before the back pain started.   He denies any shortness of breath, chest pain, palpitations or cough.  Ruben Turner states he has a history of intravenous drug use.  He initially was in remission, however due to severity of pain, relapsed approximately 2 months ago.  He predominantly uses fentanyl .  ED course: On arrival to the ED, patient was normotensive at 125/105 with heart rate of 100.  He was saturating at 100% on room air.  He was afebrile at 98.5.Initial workup including CT of the thoracic and lumbar spine demonstrated multiple abnormalities, specifically lytic destruction of T8-10, T8 transverse process fracture, fracture of right eighth rib, right middle and lower lobe collapse.  Neurosurgery consulted and recommending full spinal MRI.  [facility] contacted for admission.  Review of Systems: As mentioned in the history of present illness. All other systems reviewed and are negative.  History reviewed. No pertinent past medical history.  Past Surgical History:  Procedure Laterality Date   KNEE SURGERY Left    SHOULDER ARTHROSCOPY Right    Social History:  reports that he has been smoking cigarettes. He has never used smokeless  tobacco. He reports that he does not drink alcohol. No history on file for drug use.  Allergies  Allergen Reactions   Erythromycin    Penicillins     Chest tightness     History reviewed. No pertinent family history.  Prior to Admission medications   Medication Sig Start Date End Date Taking? Authorizing Provider  cyclobenzaprine  (FLEXERIL ) 10 MG tablet Take 1 tablet (10 mg total) by mouth 3 (three) times daily as needed for muscle spasms. 12/18/23   Levander Slate, MD  lidocaine  (LIDOCAINE  PAIN RELIEF) 4 % Place 1 patch onto the skin daily for 10 days. 12/18/23 12/28/23  Levander Slate, MD  sulfamethoxazole -trimethoprim  (BACTRIM  DS,SEPTRA  DS) 800-160 MG tablet Take 1 tablet by mouth 2 (two) times daily. 03/20/18   Servando Hire, MD    Physical Exam: Vitals:   12/19/23 0548 12/19/23 1037 12/19/23 1446  BP: (!) 125/105 (!) 120/98 (!) 118/90  Pulse: 100 90 88  Resp: 18 18 18   Temp: 98.5 F (36.9 C) 98 F (36.7 C) 98 F (36.7 C)  TempSrc: Oral Oral Oral  SpO2: 100% 100% 98%  Weight: 77.1 kg    Height: 5' 11 (1.803 m)     Physical Exam Vitals and nursing note reviewed.  Constitutional:      General: He is in acute distress (2/2 pain).     Appearance: He is normal weight.  HENT:     Head: Normocephalic and atraumatic.     Mouth/Throat:     Mouth: Mucous membranes are dry.  Pharynx: Oropharynx is clear.  Eyes:     Conjunctiva/sclera: Conjunctivae normal.     Pupils: Pupils are equal, round, and reactive to light.  Cardiovascular:     Rate and Rhythm: Normal rate and regular rhythm.     Heart sounds: No murmur heard.    No gallop.  Pulmonary:     Effort: Pulmonary effort is normal. No respiratory distress.     Breath sounds: Normal breath sounds. No wheezing, rhonchi or rales.  Abdominal:     Palpations: Abdomen is soft.  Musculoskeletal:     Right lower leg: No edema.     Left lower leg: No edema.  Skin:    General: Skin is warm and dry.     Findings: No laceration,  lesion, petechiae or rash.  Neurological:     General: No focal deficit present.     Mental Status: He is alert and oriented to person, place, and time. Mental status is at baseline.     Motor: No weakness.     Gait: Gait normal.  Psychiatric:        Mood and Affect: Mood normal.        Behavior: Behavior normal.    Data Reviewed: CBC with WBC of 9.3, hemoglobin of 10.1, platelets of 292 CMP with sodium of 136, potassium 2.9, bicarbonate 23, BUN 14, creatinine 0.71, AST 21, ALT 16 and GFR above 60 Lactic acid within normal limits ESR elevated at 82  MR Cervical Spine W and Wo Contrast Result Date: 12/19/2023 CLINICAL DATA:  Bone mass or bone pain, pathologic fracture on same-day CT thoracic spine EXAM: MRI CERVICAL, THORACIC AND LUMBAR SPINE WITHOUT AND WITH CONTRAST TECHNIQUE: Multiplanar and multiecho pulse sequences of the cervical spine, to include the craniocervical junction and cervicothoracic junction, and thoracic and lumbar spine, were obtained without and with intravenous contrast. CONTRAST:  7mL GADAVIST  GADOBUTROL  1 MMOL/ML IV SOLN COMPARISON:  08/09/2011 MRI lumbar spine, no prior MRI of the cervical or thoracic spine; correlation is made with CT thoracic and lumbar spine 12/19/2023; no prior CT of the cervical spine available. FINDINGS: Evaluation is somewhat limited by motion, particularly on the postcontrast images. In addition, the patient could not complete the postcontrast imaging. No post-contrast axial could be obtained on the cervical and lumbar spine. MRI CERVICAL SPINE FINDINGS Alignment: No significant listhesis. Vertebrae: No acute fracture, evidence of discitis, or suspicious osseous lesion. Cord: Normal in signal and morphology. Posterior Fossa, vertebral arteries, paraspinal tissues: Low lying cerebellar tonsils, which extend approximately 4 mm below the foramen magnum and do not meet criteria for Chiari 1 malformation. Normal vertebral artery flow voids. Disc levels:  C2-C3: No significant disc bulge. No spinal canal stenosis or neuroforaminal narrowing. C3-C4: No significant disc bulge. Uncovertebral hypertrophy. No spinal canal stenosis. Mild right neural foraminal narrowing. C4-C5: Disc height loss and minimal disc bulge. Facet and uncovertebral hypertrophy. No spinal canal stenosis. Severe left and mild right neural foraminal narrowing. C5-C6: Disc height loss. Facet and uncovertebral hypertrophy. No spinal canal stenosis. Severe left and moderate right neural foraminal narrowing. C6-C7: Left paracentral disc protrusion. Facet arthropathy. No spinal canal stenosis or neural foraminal narrowing. C7-T1: No significant disc bulge. Facet arthropathy. No spinal canal stenosis. Mild right neural foraminal narrowing. MRI THORACIC SPINE FINDINGS Alignment: Focal kyphosis centered on the T8, T9, and T10 vertebral body fractures. Otherwise normal alignment. Vertebrae: Redemonstrated destruction of the majority of the T9 vertebral body, for which only a portion of the posterior cortex is seen.  The posterior cortex extends approximately 8 mm into the spinal canal. Significant loss of the T8 vertebral body, with only 60% vertebral body height at the anterior aspect and 50% of the vertebral body present at the posterior aspect. At the superior aspect of T10, there is approximately 20% vertebral body height loss. Decreased T1 and increased T2 signal is seen throughout the T8, T9, and T10 vertebral bodies, with abnormal signal extending into posterior elements (series 19, image 5 and 17). Although postcontrast imaging is of limited utility, there appears to be diffuse enhancement in this area (series 24, image 10). Similar abnormal signal in the left third rib (series 10, image 20), which also appears to enhance (series 24, image 28). Cord: Mild cord deformation at T9, without abnormal spinal cord signal. No abnormal enhancement. Paraspinal and other soft tissues: Masslike enhancement in the  medial right lung, possibly at the right hilum (series 23, image 28). Disc levels: Moderate spinal canal stenosis posterior to T8-T9 through T9-T10, secondary to retropulsion (series 20, image 30). Severe bilateral neural foraminal narrowing at T8-T9 and T9-T10. MRI LUMBAR SPINE FINDINGS Segmentation:  5 lumbar type vertebral bodies. Alignment: Dextroscoliosis. Trace retrolisthesis of L1 on L2. Trace anterolisthesis of L4 on L5. Vertebrae: No acute fracture, evidence of discitis, or suspicious osseous lesion. Endplate degenerative changes, most prominent at L3-L4, eccentric to the left and at L5-S1, eccentric to the right. No abnormal enhancement. Congenitally short pedicles, which narrow the AP diameter of the spinal canal. Conus medullaris and cauda equina: Conus extends to the L2 level. Conus and cauda equina appear normal. Paraspinal and other soft tissues:  No acute finding. Disc levels: T12-L1: No significant disc bulge. No spinal canal stenosis or neural foraminal narrowing. L1-L2: No significant disc bulge. Mild facet arthropathy. Trace retrolisthesis. No spinal canal stenosis. Mild left neural foraminal narrowing. L2-L3: Minimal disc bulge. Mild facet arthropathy. No spinal canal stenosis or neural foraminal narrowing. L3-L4: Mild disc bulge. Mild-to-moderate facet arthropathy. Narrowing of the lateral recesses. Mild spinal canal stenosis. Moderate left and mild right neural foraminal narrowing. L4-L5: Trace anterolisthesis with disc unroofing and mild disc bulge. Severe facet arthropathy. Narrowing of the lateral recesses. Mild-to-moderate spinal canal stenosis. Mild bilateral neural foraminal narrowing. L5-S1: Minimal disc bulge with right eccentric disc osteophyte complex, which may contact the exiting right L5 nerve roots. Moderate right and mild left facet arthropathy. No spinal canal stenosis. Moderate right neural foraminal narrowing. IMPRESSION: 1. Evaluation is somewhat limited by motion and the  patient could not complete the postcontrast imaging. No post-contrast axial could be obtained on the cervical and lumbar spine. 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 No cord signal abnormality. 2. Abnormal signal and enhancement in the left third rib. Findings are concerning for additional site of metastatic disease. 3. Masslike enhancement in the medial right lung, possibly at the right hilum, concerning for a primary neoplastic process or metastatic disease. Recommend further evaluation with a CT of the chest with contrast. 4. L3-L4 mild spinal canal stenosis with moderate left and mild right neural foraminal narrowing. Narrowing of the lateral recesses at this level could affect the descending L4 nerve roots. 5. L4-L5 mild-to-moderate spinal canal stenosis and mild bilateral neural foraminal narrowing. Narrowing of the lateral recesses at this level could affect the descending L5 nerve roots. 6. L5-S1 moderate right neural foraminal narrowing. 7. C4-C5  severe left and mild right neural foraminal narrowing. 8. C5-C6 severe left and moderate right neural foraminal narrowing. Electronically Signed   By: Donald Campion M.D.   On: 12/19/2023 14:24   MR THORACIC SPINE W WO CONTRAST Result Date: 12/19/2023 CLINICAL DATA:  Bone mass or bone pain, pathologic fracture on same-day CT thoracic spine EXAM: MRI CERVICAL, THORACIC AND LUMBAR SPINE WITHOUT AND WITH CONTRAST TECHNIQUE: Multiplanar and multiecho pulse sequences of the cervical spine, to include the craniocervical junction and cervicothoracic junction, and thoracic and lumbar spine, were obtained without and with intravenous contrast. CONTRAST:  7mL GADAVIST  GADOBUTROL  1 MMOL/ML IV SOLN COMPARISON:  08/09/2011 MRI lumbar spine, no prior MRI of the cervical or thoracic spine; correlation is  made with CT thoracic and lumbar spine 12/19/2023; no prior CT of the cervical spine available. FINDINGS: Evaluation is somewhat limited by motion, particularly on the postcontrast images. In addition, the patient could not complete the postcontrast imaging. No post-contrast axial could be obtained on the cervical and lumbar spine. MRI CERVICAL SPINE FINDINGS Alignment: No significant listhesis. Vertebrae: No acute fracture, evidence of discitis, or suspicious osseous lesion. Cord: Normal in signal and morphology. Posterior Fossa, vertebral arteries, paraspinal tissues: Low lying cerebellar tonsils, which extend approximately 4 mm below the foramen magnum and do not meet criteria for Chiari 1 malformation. Normal vertebral artery flow voids. Disc levels: C2-C3: No significant disc bulge. No spinal canal stenosis or neuroforaminal narrowing. C3-C4: No significant disc bulge. Uncovertebral hypertrophy. No spinal canal stenosis. Mild right neural foraminal narrowing. C4-C5: Disc height loss and minimal disc bulge. Facet and uncovertebral hypertrophy. No spinal canal stenosis. Severe left and mild right neural foraminal narrowing. C5-C6: Disc height loss. Facet and uncovertebral hypertrophy. No spinal canal stenosis. Severe left and moderate right neural foraminal narrowing. C6-C7: Left paracentral disc protrusion. Facet arthropathy. No spinal canal stenosis or neural foraminal narrowing. C7-T1: No significant disc bulge. Facet arthropathy. No spinal canal stenosis. Mild right neural foraminal narrowing. MRI THORACIC SPINE FINDINGS Alignment: Focal kyphosis centered on the T8, T9, and T10 vertebral body fractures. Otherwise normal alignment. Vertebrae: Redemonstrated destruction of the majority of the T9 vertebral body, for which only a portion of the posterior cortex is seen. The posterior cortex extends approximately 8 mm into the spinal canal. Significant loss of the T8 vertebral body, with only 60% vertebral body  height at the anterior aspect and 50% of the vertebral body present at the posterior aspect. At the superior aspect of T10, there is approximately 20% vertebral body height loss. Decreased T1 and increased T2 signal is seen throughout the T8, T9, and T10 vertebral bodies, with abnormal signal extending into posterior elements (series 19, image 5 and 17). Although postcontrast imaging is of limited utility, there appears to be diffuse enhancement in this area (series 24, image 10). Similar abnormal signal in the left third rib (series 10, image 20), which also appears to enhance (series 24, image 28). Cord: Mild cord deformation at T9, without abnormal spinal cord signal. No abnormal enhancement. Paraspinal and other soft tissues: Masslike enhancement in the medial right lung, possibly at the right hilum (series 23, image 28). Disc levels: Moderate spinal canal stenosis posterior to T8-T9 through T9-T10, secondary to retropulsion (series 20, image 30). Severe bilateral neural foraminal narrowing at T8-T9 and T9-T10. MRI LUMBAR SPINE FINDINGS Segmentation:  5 lumbar type vertebral bodies. Alignment: Dextroscoliosis. Trace retrolisthesis of L1 on L2. Trace anterolisthesis of L4 on L5. Vertebrae: No acute fracture, evidence of discitis, or  suspicious osseous lesion. Endplate degenerative changes, most prominent at L3-L4, eccentric to the left and at L5-S1, eccentric to the right. No abnormal enhancement. Congenitally short pedicles, which narrow the AP diameter of the spinal canal. Conus medullaris and cauda equina: Conus extends to the L2 level. Conus and cauda equina appear normal. Paraspinal and other soft tissues:  No acute finding. Disc levels: T12-L1: No significant disc bulge. No spinal canal stenosis or neural foraminal narrowing. L1-L2: No significant disc bulge. Mild facet arthropathy. Trace retrolisthesis. No spinal canal stenosis. Mild left neural foraminal narrowing. L2-L3: Minimal disc bulge. Mild facet  arthropathy. No spinal canal stenosis or neural foraminal narrowing. L3-L4: Mild disc bulge. Mild-to-moderate facet arthropathy. Narrowing of the lateral recesses. Mild spinal canal stenosis. Moderate left and mild right neural foraminal narrowing. L4-L5: Trace anterolisthesis with disc unroofing and mild disc bulge. Severe facet arthropathy. Narrowing of the lateral recesses. Mild-to-moderate spinal canal stenosis. Mild bilateral neural foraminal narrowing. L5-S1: Minimal disc bulge with right eccentric disc osteophyte complex, which may contact the exiting right L5 nerve roots. Moderate right and mild left facet arthropathy. No spinal canal stenosis. Moderate right neural foraminal narrowing. IMPRESSION: 1. Evaluation is somewhat limited by motion and the patient could not complete the postcontrast imaging. No post-contrast axial could be obtained on the cervical and lumbar spine. 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 No cord signal abnormality. 2. Abnormal signal and enhancement in the left third rib. Findings are concerning for additional site of metastatic disease. 3. Masslike enhancement in the medial right lung, possibly at the right hilum, concerning for a primary neoplastic process or metastatic disease. Recommend further evaluation with a CT of the chest with contrast. 4. L3-L4 mild spinal canal stenosis with moderate left and mild right neural foraminal narrowing. Narrowing of the lateral recesses at this level could affect the descending L4 nerve roots. 5. L4-L5 mild-to-moderate spinal canal stenosis and mild bilateral neural foraminal narrowing. Narrowing of the lateral recesses at this level could affect the descending L5 nerve roots. 6. L5-S1 moderate right neural foraminal narrowing. 7. C4-C5 severe left and mild right neural  foraminal narrowing. 8. C5-C6 severe left and moderate right neural foraminal narrowing. Electronically Signed   By: Donald Campion M.D.   On: 12/19/2023 14:24   MR Lumbar Spine W Wo Contrast Result Date: 12/19/2023 CLINICAL DATA:  Bone mass or bone pain, pathologic fracture on same-day CT thoracic spine EXAM: MRI CERVICAL, THORACIC AND LUMBAR SPINE WITHOUT AND WITH CONTRAST TECHNIQUE: Multiplanar and multiecho pulse sequences of the cervical spine, to include the craniocervical junction and cervicothoracic junction, and thoracic and lumbar spine, were obtained without and with intravenous contrast. CONTRAST:  7mL GADAVIST  GADOBUTROL  1 MMOL/ML IV SOLN COMPARISON:  08/09/2011 MRI lumbar spine, no prior MRI of the cervical or thoracic spine; correlation is made with CT thoracic and lumbar spine 12/19/2023; no prior CT of the cervical spine available. FINDINGS: Evaluation is somewhat limited by motion, particularly on the postcontrast images. In addition, the patient could not complete the postcontrast imaging. No post-contrast axial could be obtained on the cervical and lumbar spine. MRI CERVICAL SPINE FINDINGS Alignment: No significant listhesis. Vertebrae: No acute fracture, evidence of discitis, or suspicious osseous lesion. Cord: Normal in signal and morphology. Posterior Fossa, vertebral arteries, paraspinal tissues: Low lying cerebellar tonsils, which extend approximately 4 mm below the foramen  magnum and do not meet criteria for Chiari 1 malformation. Normal vertebral artery flow voids. Disc levels: C2-C3: No significant disc bulge. No spinal canal stenosis or neuroforaminal narrowing. C3-C4: No significant disc bulge. Uncovertebral hypertrophy. No spinal canal stenosis. Mild right neural foraminal narrowing. C4-C5: Disc height loss and minimal disc bulge. Facet and uncovertebral hypertrophy. No spinal canal stenosis. Severe left and mild right neural foraminal narrowing. C5-C6: Disc height loss. Facet and  uncovertebral hypertrophy. No spinal canal stenosis. Severe left and moderate right neural foraminal narrowing. C6-C7: Left paracentral disc protrusion. Facet arthropathy. No spinal canal stenosis or neural foraminal narrowing. C7-T1: No significant disc bulge. Facet arthropathy. No spinal canal stenosis. Mild right neural foraminal narrowing. MRI THORACIC SPINE FINDINGS Alignment: Focal kyphosis centered on the T8, T9, and T10 vertebral body fractures. Otherwise normal alignment. Vertebrae: Redemonstrated destruction of the majority of the T9 vertebral body, for which only a portion of the posterior cortex is seen. The posterior cortex extends approximately 8 mm into the spinal canal. Significant loss of the T8 vertebral body, with only 60% vertebral body height at the anterior aspect and 50% of the vertebral body present at the posterior aspect. At the superior aspect of T10, there is approximately 20% vertebral body height loss. Decreased T1 and increased T2 signal is seen throughout the T8, T9, and T10 vertebral bodies, with abnormal signal extending into posterior elements (series 19, image 5 and 17). Although postcontrast imaging is of limited utility, there appears to be diffuse enhancement in this area (series 24, image 10). Similar abnormal signal in the left third rib (series 10, image 20), which also appears to enhance (series 24, image 28). Cord: Mild cord deformation at T9, without abnormal spinal cord signal. No abnormal enhancement. Paraspinal and other soft tissues: Masslike enhancement in the medial right lung, possibly at the right hilum (series 23, image 28). Disc levels: Moderate spinal canal stenosis posterior to T8-T9 through T9-T10, secondary to retropulsion (series 20, image 30). Severe bilateral neural foraminal narrowing at T8-T9 and T9-T10. MRI LUMBAR SPINE FINDINGS Segmentation:  5 lumbar type vertebral bodies. Alignment: Dextroscoliosis. Trace retrolisthesis of L1 on L2. Trace  anterolisthesis of L4 on L5. Vertebrae: No acute fracture, evidence of discitis, or suspicious osseous lesion. Endplate degenerative changes, most prominent at L3-L4, eccentric to the left and at L5-S1, eccentric to the right. No abnormal enhancement. Congenitally short pedicles, which narrow the AP diameter of the spinal canal. Conus medullaris and cauda equina: Conus extends to the L2 level. Conus and cauda equina appear normal. Paraspinal and other soft tissues:  No acute finding. Disc levels: T12-L1: No significant disc bulge. No spinal canal stenosis or neural foraminal narrowing. L1-L2: No significant disc bulge. Mild facet arthropathy. Trace retrolisthesis. No spinal canal stenosis. Mild left neural foraminal narrowing. L2-L3: Minimal disc bulge. Mild facet arthropathy. No spinal canal stenosis or neural foraminal narrowing. L3-L4: Mild disc bulge. Mild-to-moderate facet arthropathy. Narrowing of the lateral recesses. Mild spinal canal stenosis. Moderate left and mild right neural foraminal narrowing. L4-L5: Trace anterolisthesis with disc unroofing and mild disc bulge. Severe facet arthropathy. Narrowing of the lateral recesses. Mild-to-moderate spinal canal stenosis. Mild bilateral neural foraminal narrowing. L5-S1: Minimal disc bulge with right eccentric disc osteophyte complex, which may contact the exiting right L5 nerve roots. Moderate right and mild left facet arthropathy. No spinal canal stenosis. Moderate right neural foraminal narrowing. IMPRESSION: 1. Evaluation is somewhat limited by motion and the patient could not complete the postcontrast imaging. No post-contrast axial could  be obtained on the cervical and lumbar spine. 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 No cord signal abnormality. 2. Abnormal signal and enhancement  in the left third rib. Findings are concerning for additional site of metastatic disease. 3. Masslike enhancement in the medial right lung, possibly at the right hilum, concerning for a primary neoplastic process or metastatic disease. Recommend further evaluation with a CT of the chest with contrast. 4. L3-L4 mild spinal canal stenosis with moderate left and mild right neural foraminal narrowing. Narrowing of the lateral recesses at this level could affect the descending L4 nerve roots. 5. L4-L5 mild-to-moderate spinal canal stenosis and mild bilateral neural foraminal narrowing. Narrowing of the lateral recesses at this level could affect the descending L5 nerve roots. 6. L5-S1 moderate right neural foraminal narrowing. 7. C4-C5 severe left and mild right neural foraminal narrowing. 8. C5-C6 severe left and moderate right neural foraminal narrowing. Electronically Signed   By: Donald Campion M.D.   On: 12/19/2023 14:24   CT Lumbar Spine Wo Contrast Result Date: 12/19/2023 CLINICAL DATA:  Low back pain. Cancer suspected. Pain over the last 2 months. EXAM: CT LUMBAR SPINE WITHOUT CONTRAST TECHNIQUE: Multidetector CT imaging of the lumbar spine was performed without intravenous contrast administration. Multiplanar CT image reconstructions were also generated. RADIATION DOSE REDUCTION: This exam was performed according to the departmental dose-optimization program which includes automated exposure control, adjustment of the mA and/or kV according to patient size and/or use of iterative reconstruction technique. COMPARISON:  Thoracic CT same day FINDINGS: Segmentation: 5 lumbar type vertebral bodies. Alignment: Early Od a curvature convex to the right with the apex at L3. Degenerative anterolisthesis at L4-5 of 4 mm. Vertebrae: Degenerative endplate changes on the left at L3-4 and on the right at L4-5 and L5-S1 with sclerotic and early cystic changes. Cystic arthropathy present affecting the facet joints at L2-3,  L3-4, L4-5 and L5-S1 as below. Paraspinal and other soft tissues: Negative. No evidence of adenopathy or mass lesion. Disc levels: T12-L1: Normal interspace. L1-2: Mild bulging of the disc more towards the left. No compressive stenosis. L2-3: Disc degeneration more pronounced on the left. Mild bulging of the disc. No compressive stenosis. Mild facet arthropathy on the left. L3-4: Disc degeneration more pronounced on the left with disc space narrowing, sclerotic endplate changes and underlying cystic endplate changes. Circumferential bulging of the disc. Bilateral facet arthropathy with cystic changes. Moderate multifactorial spinal stenosis at this level that could be symptomatic, particularly on the left. L4-5: Facet arthropathy with cystic changes, worse on the right. Anterolisthesis of 4 mm. Disc degeneration more pronounced on the right with sclerotic and cystic endplate changes. Stenosis of the canal and lateral recesses that could cause neural compression, more likely on the right. L5-S1: Disc degeneration more pronounced on the right with vacuum phenomenon, endplate sclerotic changes and cystic changes. Facet arthropathy on the right with cystic changes. No central canal stenosis. Right foraminal stenosis that could affect the exiting right L5 nerve. IMPRESSION: 1. No evidence of malignancy. 2. Scoliosis convex to the right. 3. L3-4: Disc degeneration more pronounced on the left with disc space narrowing, sclerotic endplate changes and underlying cystic endplate changes. Circumferential bulging of the disc. Bilateral facet arthropathy with cystic changes. Moderate multifactorial spinal stenosis at this level that could be symptomatic, particularly on the left. 4. L4-5: Facet arthropathy with  cystic changes, worse on the right. Anterolisthesis of 4 mm. Disc degeneration more pronounced on the right with sclerotic and cystic endplate changes. Stenosis of the canal and lateral recesses that could cause neural  compression, more likely on the right. 5. L5-S1: Disc degeneration more pronounced on the right with vacuum phenomenon, endplate sclerotic changes and cystic changes. Facet arthropathy on the right with cystic changes. Right foraminal stenosis that could affect the exiting right L5 nerve. 6. The appearance of the endplate degenerative changes and the facet arthropathy is unusual for a person of this age, with prominent cystic changes. Findings could be seen with a systemic arthropathy such as CPPD or gout. The findings in this region are not specifically suggestive of tuberculosis of the spine as was suggested in the thoracic region. Electronically Signed   By: Oneil Officer M.D.   On: 12/19/2023 11:37   CT Thoracic Spine Wo Contrast Result Date: 12/19/2023 CLINICAL DATA:  Bone lesion, thoracic spine, incidental. Back pain over the last 2 months. EXAM: CT THORACIC SPINE WITHOUT CONTRAST TECHNIQUE: Multidetector CT images of the thoracic were obtained using the standard protocol without intravenous contrast. RADIATION DOSE REDUCTION: This exam was performed according to the departmental dose-optimization program which includes automated exposure control, adjustment of the mA and/or kV according to patient size and/or use of iterative reconstruction technique. COMPARISON:  Lumbar MRI 08/09/2011 FINDINGS: Alignment: Kyphosis of the thoracic spine with the apex at T9-10, 55 degrees by measurement. Vertebrae: Lytic destruction of the T9 vertebral body. Posterior bowing of the posterior margin of the vertebral body. Lytic destruction of the inferior aspect of the T8 vertebral body in the superior aspect of the T10 vertebral body. There is an acute fracture of the right posterior eighth rib. There is a fracture of the transverse process on the right at T8. Paraspinal and other soft tissues: Some inflammatory changes of the posterior mediastinal soft tissues in the region from T8-T10. I do not identify definite soft  tissue canal compromise, but MRI of the thoracic spine is recommended with and without contrast. Patient has obstruction/occlusion of the right middle lobe and lower lobe bronchi with volume loss of the lung in those regions. Disc levels: No significant abnormality seen from T1 through T7 and from the T10-11 disc level to the lumbar region. IMPRESSION: 1. Lytic destruction of the T9 vertebral body with posterior bowing of the posterior margin of the vertebral body. Lytic destruction of the inferior aspect of the T8 vertebral body and the superior aspect of the T10 vertebral body. Acute fracture of the right posterior eighth rib. Fracture of the transverse process on the right at T8. Some surrounding soft tissue inflammatory change within the posterior mediastinum but no evidence of low-density abscess formation. Lung disease affecting the right middle and lower lobes. 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 This is a rare diagnosis and the differential diagnosis would also include metastatic disease and myeloma/plasmacytoma. Pyogenic spinal infection is less likely given the absence of an elevated white count and the absence of more pronounced surrounding soft tissue changes. Electronically Signed   By: Oneil Officer M.D.   On: 12/19/2023 11:24   Results are pending, will review when available.  Assessment and Plan:  * Lytic bone lesions (T8 -10) Patient presenting with severe back pain for 2 months found to have multiple lytic lesions involving T8-T10, concerning for metastatic disease.  - Neurosurgery  consulted; appreciate their recommendations - Will discuss with IR to assess if they can obtain biopsy - Pain control with Tylenol , oxycodone , Dilaudid   Pulmonary mass On thoracic imaging, there is evidence of masslike enhancement of the medial right lung.  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   - CT chest/abdomen/pelvis with contrast - Oncology consulted; appreciate their recommendations  Opioid use disorder Patient reports active intravenous fentanyl  use.  - HIV and hepatitis panel pending - Prior to discharge, will benefit from [facility] provided resources - Consider starting Suboxone prior to discharge  Advance Care Planning:   Code Status: Full Code   Consults: Neurosurgery, Oncology  Family Communication: No family at bedside.   Severity of Illness: The appropriate patient status for this patient is OBSERVATION. Observation status is judged to be reasonable and necessary in order to provide the required intensity of service to ensure the patient's safety. The patient's presenting symptoms, physical exam findings, and initial radiographic and laboratory data in the context of their medical condition is felt to place them at decreased risk for further clinical deterioration. Furthermore, it is anticipated that the patient will be medically stable for discharge from the hospital within 2 midnights of admission.   Author: Clayborne Broom, MD 12/19/2023 3:04 PM  For on call review [email or web address].

## 2023-12-19 NOTE — Assessment & Plan Note (Signed)
 On thoracic imaging, there is evidence of masslike enhancement of the medial right lung.  Given evidence of right middle lobe and right lower lobe collapse on CT imaging, strong concern for possible pulmonary primary given osseous metastatic lesions.   - CT chest/abdomen/pelvis with contrast - Oncology consulted; appreciate their recommendations

## 2023-12-20 ENCOUNTER — Encounter: Payer: Self-pay | Admitting: Internal Medicine

## 2023-12-20 ENCOUNTER — Inpatient Hospital Stay: Payer: MEDICAID

## 2023-12-20 DIAGNOSIS — A498 Other bacterial infections of unspecified site: Secondary | ICD-10-CM | POA: Diagnosis present

## 2023-12-20 DIAGNOSIS — M4624 Osteomyelitis of vertebra, thoracic region: Secondary | ICD-10-CM | POA: Diagnosis present

## 2023-12-20 DIAGNOSIS — Z881 Allergy status to other antibiotic agents status: Secondary | ICD-10-CM | POA: Diagnosis not present

## 2023-12-20 DIAGNOSIS — G9589 Other specified diseases of spinal cord: Secondary | ICD-10-CM | POA: Diagnosis present

## 2023-12-20 DIAGNOSIS — D649 Anemia, unspecified: Secondary | ICD-10-CM | POA: Diagnosis present

## 2023-12-20 DIAGNOSIS — M4644 Discitis, unspecified, thoracic region: Secondary | ICD-10-CM | POA: Diagnosis present

## 2023-12-20 DIAGNOSIS — R918 Other nonspecific abnormal finding of lung field: Secondary | ICD-10-CM | POA: Diagnosis not present

## 2023-12-20 DIAGNOSIS — M899 Disorder of bone, unspecified: Secondary | ICD-10-CM | POA: Diagnosis present

## 2023-12-20 DIAGNOSIS — F1190 Opioid use, unspecified, uncomplicated: Secondary | ICD-10-CM | POA: Diagnosis not present

## 2023-12-20 DIAGNOSIS — E8809 Other disorders of plasma-protein metabolism, not elsewhere classified: Secondary | ICD-10-CM | POA: Diagnosis present

## 2023-12-20 DIAGNOSIS — M8448XA Pathological fracture, other site, initial encounter for fracture: Secondary | ICD-10-CM | POA: Diagnosis present

## 2023-12-20 DIAGNOSIS — Z880 Allergy status to penicillin: Secondary | ICD-10-CM | POA: Diagnosis not present

## 2023-12-20 DIAGNOSIS — J40 Bronchitis, not specified as acute or chronic: Secondary | ICD-10-CM | POA: Diagnosis present

## 2023-12-20 DIAGNOSIS — M4804 Spinal stenosis, thoracic region: Secondary | ICD-10-CM | POA: Diagnosis present

## 2023-12-20 DIAGNOSIS — F1110 Opioid abuse, uncomplicated: Secondary | ICD-10-CM | POA: Diagnosis present

## 2023-12-20 DIAGNOSIS — F17210 Nicotine dependence, cigarettes, uncomplicated: Secondary | ICD-10-CM | POA: Diagnosis present

## 2023-12-20 DIAGNOSIS — M4850XA Collapsed vertebra, not elsewhere classified, site unspecified, initial encounter for fracture: Secondary | ICD-10-CM | POA: Diagnosis not present

## 2023-12-20 DIAGNOSIS — M532X4 Spinal instabilities, thoracic region: Secondary | ICD-10-CM | POA: Diagnosis not present

## 2023-12-20 LAB — BLOOD CULTURE ID PANEL (REFLEXED) - BCID2

## 2023-12-20 LAB — BASIC METABOLIC PANEL
Anion gap: 12 (ref 5–15)
BUN: 11 mg/dL (ref 6–20)
CO2: 23 mmol/L (ref 22–32)
Calcium: 8.8 mg/dL — ABNORMAL LOW (ref 8.9–10.3)
Chloride: 97 mmol/L — ABNORMAL LOW (ref 98–111)
Creatinine, Ser: 0.63 mg/dL (ref 0.61–1.24)
GFR, Estimated: >60 mL/min (ref >60)
Glucose, Bld: 97 mg/dL (ref 70–99)
Potassium: 3.8 mmol/L (ref 3.5–5.1)
Sodium: 132 mmol/L — ABNORMAL LOW (ref 135–145)

## 2023-12-20 LAB — CBC WITH DIFFERENTIAL/PLATELET
Abs Immature Granulocytes: 0.04 10*3/uL (ref 0.00–0.07)
Basophils Absolute: 0 10*3/uL (ref 0.0–0.1)
Basophils Relative: 0 %
Eosinophils Absolute: 0 10*3/uL (ref 0.0–0.5)
Eosinophils Relative: 0 %
HCT: 34.1 % — ABNORMAL LOW (ref 39.0–52.0)
Hemoglobin: 11.3 g/dL — ABNORMAL LOW (ref 13.0–17.0)
Immature Granulocytes: 0 %
Lymphocytes Relative: 14 %
Lymphs Abs: 1.4 10*3/uL (ref 0.7–4.0)
MCH: 27.4 pg (ref 26.0–34.0)
MCHC: 33.1 g/dL (ref 30.0–36.0)
MCV: 82.6 fL (ref 80.0–100.0)
Monocytes Absolute: 0.4 10*3/uL (ref 0.1–1.0)
Monocytes Relative: 4 %
Neutro Abs: 7.8 10*3/uL — ABNORMAL HIGH (ref 1.7–7.7)
Neutrophils Relative %: 82 %
Platelets: 339 10*3/uL (ref 150–400)
RBC: 4.13 MIL/uL — ABNORMAL LOW (ref 4.22–5.81)
RDW: 14.8 % (ref 11.5–15.5)
WBC: 9.6 10*3/uL (ref 4.0–10.5)
nRBC: 0 % (ref 0.0–0.2)

## 2023-12-20 LAB — HEPATITIS PANEL, ACUTE
HCV Ab: REACTIVE — AB
Hep A IgM: NONREACTIVE
Hep B C IgM: NONREACTIVE
Hepatitis B Surface Ag: NONREACTIVE

## 2023-12-20 LAB — HIV ANTIBODY (ROUTINE TESTING W REFLEX): HIV Screen 4th Generation wRfx: NONREACTIVE

## 2023-12-20 MED ORDER — FENTANYL CITRATE (PF) 100 MCG/2ML IJ SOLN
INTRAMUSCULAR | Status: AC
  Filled 2023-12-20: qty 2

## 2023-12-20 MED ORDER — METHADONE HCL 10 MG PO TABS: 90.0000 mg | ORAL_TABLET | Freq: Every day | ORAL | Status: DC

## 2023-12-20 MED ORDER — MIDAZOLAM HCL 2 MG/2ML IJ SOLN
INTRAMUSCULAR | Status: AC
Start: 2023-12-20 — End: ?
  Filled 2023-12-20: qty 4

## 2023-12-20 MED ORDER — METHADONE HCL 10 MG PO TABS
90.0000 mg | ORAL_TABLET | Freq: Every day | ORAL | Status: DC
  Administered 2023-12-20 – 2023-12-23 (×4): 90 mg via ORAL
  Filled 2023-12-20 (×4): qty 9

## 2023-12-20 MED ORDER — FENTANYL CITRATE PF 50 MCG/ML IJ SOSY
25.0000 ug | PREFILLED_SYRINGE | Freq: Once | INTRAMUSCULAR | Status: AC
  Administered 2023-12-20: 25 ug via INTRAVENOUS

## 2023-12-20 MED ORDER — HYDROMORPHONE HCL 1 MG/ML IJ SOLN
1.0000 mg | Freq: Once | INTRAMUSCULAR | Status: AC
Start: 2023-12-20 — End: 2023-12-20
  Administered 2023-12-20: 1 mg via INTRAVENOUS
  Filled 2023-12-20: qty 1

## 2023-12-20 MED ORDER — MIDAZOLAM HCL 2 MG/2ML IJ SOLN
INTRAMUSCULAR | Status: AC | PRN
  Administered 2023-12-20: .5 mg via INTRAVENOUS
  Administered 2023-12-20: 1 mg via INTRAVENOUS

## 2023-12-20 MED ORDER — FENTANYL CITRATE (PF) 100 MCG/2ML IJ SOLN
INTRAMUSCULAR | Status: AC | PRN
  Administered 2023-12-20: 50 ug via INTRAVENOUS
  Administered 2023-12-20: 25 ug via INTRAVENOUS

## 2023-12-20 NOTE — ED Notes (Signed)
 Pt is refusing to wear TLSO brace d/t pain in his ribs.

## 2023-12-20 NOTE — Progress Notes (Signed)
 Patient refusing to wear tele. States he is in too much pain. Provider aware. Will spot check patient. Will reconnect if patient will allow.

## 2023-12-20 NOTE — Progress Notes (Signed)
 Patient moving from bed to chair, very restless. Crying in pain. Provider aware and making med adjustments. Patient continues to refuse to wear tele or pulse ox.

## 2023-12-20 NOTE — Consult Note (Signed)
 Chief Complaint: Patient was seen in consultation today for midthoracic spine mass  Referring Physician(s): Dr. Jodeen Munch  Supervising Physician: Marland Silvas  Patient Status: [facility] - In-pt  History of Present Illness: Ruben Turner is a 48 y.o. male who presented to [facility] ED with worsening back pain.  Patient reported acute worsening 2 months ago while loading a tire.  He does have a history of substance abuse and has been self-medicating to control pain without improvement.   MR Cervical Thoracic Lumbar Spine showed: MPRESSION: 1. Evaluation is somewhat limited by motion and the patient could not complete the postcontrast imaging. No post-contrast axial could be obtained on the cervical and lumbar spine. 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 2. Abnormal signal and enhancement in the left third rib. Findings are concerning for additional site of metastatic disease. 3. Masslike enhancement in the medial right lung, possibly at the right hilum, concerning for a primary neoplastic process or metastatic disease. Recommend further evaluation with a CT of the chest with contrast. 4. L3-L4 mild spinal canal stenosis with moderate left and mild right neural foraminal narrowing. Narrowing of the lateral recesses at this level could affect the descending L4 nerve roots. 5. L4-L5 mild-to-moderate spinal canal stenosis and mild bilateral neural foraminal narrowing. Narrowing of the lateral recesses at this level could affect the descending L5 nerve roots. 6. L5-S1 moderate right neural foraminal narrowing. 7. C4-C5 severe left and mild right neural foraminal narrowing. 8. C5-C6 severe left and moderate right neural foraminal narrowing.  Neurosurgery has evaluated the patient and  recommended proceeding with needle biopsy.  IR consulted for same.  Case reviewed by Dr. Marlena Sima who approves patient for hte procedure.  Of note, patient also with right posterior T8 rib fracture.   He is understanding of the goals of the procedure and is agreeable to proceed.   History reviewed. No pertinent past medical history.  Past Surgical History:  Procedure Laterality Date   KNEE SURGERY Left    SHOULDER ARTHROSCOPY Right     Allergies: Erythromycin and Penicillins  Medications: Prior to Admission medications   Medication Sig Start Date End Date Taking? Authorizing Provider  methadone  (DOLOPHINE ) 10 MG tablet Take 90 mg by mouth daily.   Yes [provider]  cyclobenzaprine  (FLEXERIL ) 10 MG tablet Take 1 tablet (10 mg total) by mouth 3 (three) times daily as needed for muscle spasms. Patient not taking: Reported on 12/19/2023 12/18/23   Claria Crofts, MD  lidocaine  (LIDOCAINE  PAIN RELIEF) 4 % Place 1 patch onto the skin daily for 10 days. Patient not taking: Reported on 12/19/2023 12/18/23 12/28/23  Claria Crofts, MD  sulfamethoxazole -trimethoprim  (BACTRIM  DS,SEPTRA  DS) 800-160 MG tablet Take 1 tablet by mouth 2 (two) times daily. Patient not taking: Reported on 12/19/2023 03/20/18   Michal Agar, MD     History reviewed. No pertinent family history.  Social History   Socioeconomic History   Marital status: Single    Spouse name: Not on file   Number of children: Not on file   Years of education: Not on file   Highest education level: Not on file  Occupational History   Not on file  Tobacco Use   Smoking status: Every Day    Types: Cigarettes   Smokeless tobacco: Never  Vaping Use   Vaping status: Never  Used  Substance and Sexual Activity   Alcohol use: No   Drug use: Not on file   Sexual activity: Not on file  Other Topics Concern   Not on file  Social History Narrative   Not on file   Social Drivers of Health   Financial Resource Strain: Not on file   Food Insecurity: Not on file  Transportation Needs: Not on file  Physical Activity: Not on file  Stress: Not on file  Social Connections: Not on file     Review of Systems: A 12 point ROS discussed and pertinent positives are indicated in the HPI above.  All other systems are negative.  Review of Systems  Constitutional:  Negative for fatigue and fever.  Respiratory:  Negative for cough and shortness of breath.   Cardiovascular:  Negative for chest pain.  Gastrointestinal:  Negative for abdominal pain, nausea and vomiting.  Musculoskeletal:  Positive for back pain.  Psychiatric/Behavioral:  Negative for behavioral problems and confusion.     Vital Signs: BP 103/88   Pulse 96   Temp 98.5 F (36.9 C)   Resp 17   Ht 5\' 11"  (1.803 m)   Wt 169 lb 15.6 oz (77.1 kg)   SpO2 100%   BMI 23.71 kg/m   Physical Exam Vitals and nursing note reviewed.  Constitutional:      General: He is not in acute distress.    Appearance: Normal appearance. He is not ill-appearing.  HENT:     Mouth/Throat:     Mouth: Mucous membranes are moist.     Pharynx: Oropharynx is clear.  Cardiovascular:     Rate and Rhythm: Normal rate and regular rhythm.  Pulmonary:     Effort: Pulmonary effort is normal.     Breath sounds: Normal breath sounds.  Abdominal:     General: Abdomen is flat. There is no distension.     Palpations: Abdomen is soft.  Skin:    General: Skin is warm and dry.  Neurological:     General: No focal deficit present.     Mental Status: He is alert and oriented to person, place, and time. Mental status is at baseline.  Psychiatric:        Mood and Affect: Mood normal.        Behavior: Behavior normal.        Thought Content: Thought content normal.        Judgment: Judgment normal.      MD Evaluation Airway: WNL Heart: WNL Abdomen: WNL Chest/ Lungs: WNL ASA  Classification: 3 Mallampati/Airway Score: Two   Imaging: CT CHEST ABDOMEN PELVIS W CONTRAST Result  Date: 12/19/2023 CLINICAL DATA:  Back pain.  Evaluation of metastatic disease. EXAM: CT CHEST, ABDOMEN, AND PELVIS WITH CONTRAST TECHNIQUE: Multidetector CT imaging of the chest, abdomen and pelvis was performed following the standard protocol during bolus administration of intravenous contrast. RADIATION DOSE REDUCTION: This exam was performed according to the departmental dose-optimization program which includes automated exposure control, adjustment of the mA and/or kV according to patient size and/or use of iterative reconstruction technique. CONTRAST:  OMNIPAQUE  IOHEXOL  300 MG/ML  SOLN COMPARISON:  Thoracic and lumbar spine CT and MRI dated 12/19/2023. FINDINGS: CT CHEST FINDINGS Cardiovascular: There is no cardiomegaly or pericardial effusion. The thoracic aorta is unremarkable. The origins of the great vessels of the aortic arch and the central pulmonary arteries appear patent. Mediastinum/Nodes: No hilar or mediastinal adenopathy. The esophagus is grossly unremarkable. No mediastinal fluid collection. Lungs/Pleura:  There is right infrahilar peribronchial thickening and airspace density in the right lower lobe concerning for bronchitis and pneumonia. An underlying mass is less likely but not excluded. Clinical correlation and follow-up to resolution recommended. There is a 6 mm subpleural nodule or para fissural lymph node along the left fissure. Additional nodules along the right major fissure measure up to 4 mm. No pleural effusion or pneumothorax. The central airways remain patent. Musculoskeletal: Degenerative changes of the shoulders. Minimally displaced fracture of the posterior right eighth rib. Nondisplaced fracture of the right T8 spinous process. Destructive changes and complete collapse of T9 with posterior buckling. See report of the CT and MRI of the spine for detailed evaluation. CT ABDOMEN PELVIS FINDINGS No intra-abdominal free air or free fluid. Hepatobiliary: Subcentimeter hepatic  hypodense lesions are too small to characterize. No biliary dilatation. The gallbladder is unremarkable. Pancreas: The pancreas is grossly unremarkable as visualized. Spleen: Normal in size without focal abnormality. Adrenals/Urinary Tract: The adrenal glands are unremarkable. The kidneys, visualized ureters, and urinary bladder appear unremarkable. Stomach/Bowel: Evaluation of the bowel is limited in the absence of oral contrast and due to paucity of intra-abdominal fat. There is moderate amount of stool throughout the colon. There is no bowel obstruction or active inflammation. The appendix is not visualized with certainty. No inflammatory changes identified in the right lower quadrant. Vascular/Lymphatic: Mild atherosclerotic calcification of the abdominal aorta. The IVC is unremarkable. No portal venous gas. There is no adenopathy. Reproductive: The prostate and seminal vesicles are grossly unremarkable. No pelvic mass. Other: There is loss of subcutaneous fat and cachexia. Musculoskeletal: Degenerative changes of the spine and multilevel disc desiccation and vacuum phenomena. No acute osseous pathology. IMPRESSION: 1. Right infrahilar peribronchial thickening and airspace density in the right lower lobe concerning for bronchitis and pneumonia. An underlying mass is less likely but not excluded. Clinical correlation and follow-up to resolution recommended. 2. Minimally displaced fracture of the posterior right eighth rib. Nondisplaced fracture of the right T8 spinous process. 3. Destructive changes and complete collapse of T9 with posterior buckling. See reports for the CT and MRI of the spine. 4. No acute intra-abdominal or pelvic pathology. 5. Moderate colonic stool burden. No bowel obstruction. 6.  Aortic Atherosclerosis (ICD10-I70.0). Electronically Signed   By: Angus Bark M.D.   On: 12/19/2023 17:05   MR Cervical Spine W and Wo Contrast Result Date: 12/19/2023 CLINICAL DATA:  Bone mass or bone  pain, pathologic fracture on same-day CT thoracic spine EXAM: MRI CERVICAL, THORACIC AND LUMBAR SPINE WITHOUT AND WITH CONTRAST TECHNIQUE: Multiplanar and multiecho pulse sequences of the cervical spine, to include the craniocervical junction and cervicothoracic junction, and thoracic and lumbar spine, were obtained without and with intravenous contrast. CONTRAST:  7mL GADAVIST  GADOBUTROL  1 MMOL/ML IV SOLN COMPARISON:  08/09/2011 MRI lumbar spine, no prior MRI of the cervical or thoracic spine; correlation is made with CT thoracic and lumbar spine 12/19/2023; no prior CT of the cervical spine available. FINDINGS: Evaluation is somewhat limited by motion, particularly on the postcontrast images. In addition, the patient could not complete the postcontrast imaging. No post-contrast axial could be obtained on the cervical and lumbar spine. MRI CERVICAL SPINE FINDINGS Alignment: No significant listhesis. Vertebrae: No acute fracture, evidence of discitis, or suspicious osseous lesion. Cord: Normal in signal and morphology. Posterior Fossa, vertebral arteries, paraspinal tissues: Low lying cerebellar tonsils, which extend approximately 4 mm below the foramen magnum and do not meet criteria for Chiari 1 malformation. Normal vertebral artery flow  voids. Disc levels: C2-C3: No significant disc bulge. No spinal canal stenosis or neuroforaminal narrowing. C3-C4: No significant disc bulge. Uncovertebral hypertrophy. No spinal canal stenosis. Mild right neural foraminal narrowing. C4-C5: Disc height loss and minimal disc bulge. Facet and uncovertebral hypertrophy. No spinal canal stenosis. Severe left and mild right neural foraminal narrowing. C5-C6: Disc height loss. Facet and uncovertebral hypertrophy. No spinal canal stenosis. Severe left and moderate right neural foraminal narrowing. C6-C7: Left paracentral disc protrusion. Facet arthropathy. No spinal canal stenosis or neural foraminal narrowing. C7-T1: No significant disc  bulge. Facet arthropathy. No spinal canal stenosis. Mild right neural foraminal narrowing. MRI THORACIC SPINE FINDINGS Alignment: Focal kyphosis centered on the T8, T9, and T10 vertebral body fractures. Otherwise normal alignment. Vertebrae: Redemonstrated destruction of the majority of the T9 vertebral body, for which only a portion of the posterior cortex is seen. The posterior cortex extends approximately 8 mm into the spinal canal. Significant loss of the T8 vertebral body, with only 60% vertebral body height at the anterior aspect and 50% of the vertebral body present at the posterior aspect. At the superior aspect of T10, there is approximately 20% vertebral body height loss. Decreased T1 and increased T2 signal is seen throughout the T8, T9, and T10 vertebral bodies, with abnormal signal extending into posterior elements (series 19, image 5 and 17). Although postcontrast imaging is of limited utility, there appears to be diffuse enhancement in this area (series 24, image 10). Similar abnormal signal in the left third rib (series 10, image 20), which also appears to enhance (series 24, image 28). Cord: Mild cord deformation at T9, without abnormal spinal cord signal. No abnormal enhancement. Paraspinal and other soft tissues: Masslike enhancement in the medial right lung, possibly at the right hilum (series 23, image 28). Disc levels: Moderate spinal canal stenosis posterior to T8-T9 through T9-T10, secondary to retropulsion (series 20, image 30). Severe bilateral neural foraminal narrowing at T8-T9 and T9-T10. MRI LUMBAR SPINE FINDINGS Segmentation:  5 lumbar type vertebral bodies. Alignment: Dextroscoliosis. Trace retrolisthesis of L1 on L2. Trace anterolisthesis of L4 on L5. Vertebrae: No acute fracture, evidence of discitis, or suspicious osseous lesion. Endplate degenerative changes, most prominent at L3-L4, eccentric to the left and at L5-S1, eccentric to the right. No abnormal enhancement. Congenitally  short pedicles, which narrow the AP diameter of the spinal canal. Conus medullaris and cauda equina: Conus extends to the L2 level. Conus and cauda equina appear normal. Paraspinal and other soft tissues:  No acute finding. Disc levels: T12-L1: No significant disc bulge. No spinal canal stenosis or neural foraminal narrowing. L1-L2: No significant disc bulge. Mild facet arthropathy. Trace retrolisthesis. No spinal canal stenosis. Mild left neural foraminal narrowing. L2-L3: Minimal disc bulge. Mild facet arthropathy. No spinal canal stenosis or neural foraminal narrowing. L3-L4: Mild disc bulge. Mild-to-moderate facet arthropathy. Narrowing of the lateral recesses. Mild spinal canal stenosis. Moderate left and mild right neural foraminal narrowing. L4-L5: Trace anterolisthesis with disc unroofing and mild disc bulge. Severe facet arthropathy. Narrowing of the lateral recesses. Mild-to-moderate spinal canal stenosis. Mild bilateral neural foraminal narrowing. L5-S1: Minimal disc bulge with right eccentric disc osteophyte complex, which may contact the exiting right L5 nerve roots. Moderate right and mild left facet arthropathy. No spinal canal stenosis. Moderate right neural foraminal narrowing. IMPRESSION: 1. Evaluation is somewhat limited by motion and the patient could not complete the postcontrast imaging. No post-contrast axial could be obtained on the cervical and lumbar spine. Within this limitation, redemonstrated presumed metastatic  lesions in T8, T9, and T10, with near complete collapse of T9 and 8 mm retropulsion the posterior cortex, which results in moderate spinal canal stenosis. No cord signal abnormality. 2. Abnormal signal and enhancement in the left third rib. Findings are concerning for additional site of metastatic disease. 3. Masslike enhancement in the medial right lung, possibly at the right hilum, concerning for a primary neoplastic process or metastatic disease. Recommend further evaluation  with a CT of the chest with contrast. 4. L3-L4 mild spinal canal stenosis with moderate left and mild right neural foraminal narrowing. Narrowing of the lateral recesses at this level could affect the descending L4 nerve roots. 5. L4-L5 mild-to-moderate spinal canal stenosis and mild bilateral neural foraminal narrowing. Narrowing of the lateral recesses at this level could affect the descending L5 nerve roots. 6. L5-S1 moderate right neural foraminal narrowing. 7. C4-C5 severe left and mild right neural foraminal narrowing. 8. C5-C6 severe left and moderate right neural foraminal narrowing. Electronically Signed   By: Zoila Hines M.D.   On: 12/19/2023 14:24   MR THORACIC SPINE W WO CONTRAST Result Date: 12/19/2023 CLINICAL DATA:  Bone mass or bone pain, pathologic fracture on same-day CT thoracic spine EXAM: MRI CERVICAL, THORACIC AND LUMBAR SPINE WITHOUT AND WITH CONTRAST TECHNIQUE: Multiplanar and multiecho pulse sequences of the cervical spine, to include the craniocervical junction and cervicothoracic junction, and thoracic and lumbar spine, were obtained without and with intravenous contrast. CONTRAST:  7mL GADAVIST  GADOBUTROL  1 MMOL/ML IV SOLN COMPARISON:  08/09/2011 MRI lumbar spine, no prior MRI of the cervical or thoracic spine; correlation is made with CT thoracic and lumbar spine 12/19/2023; no prior CT of the cervical spine available. FINDINGS: Evaluation is somewhat limited by motion, particularly on the postcontrast images. In addition, the patient could not complete the postcontrast imaging. No post-contrast axial could be obtained on the cervical and lumbar spine. MRI CERVICAL SPINE FINDINGS Alignment: No significant listhesis. Vertebrae: No acute fracture, evidence of discitis, or suspicious osseous lesion. Cord: Normal in signal and morphology. Posterior Fossa, vertebral arteries, paraspinal tissues: Low lying cerebellar tonsils, which extend approximately 4 mm below the foramen magnum and do  not meet criteria for Chiari 1 malformation. Normal vertebral artery flow voids. Disc levels: C2-C3: No significant disc bulge. No spinal canal stenosis or neuroforaminal narrowing. C3-C4: No significant disc bulge. Uncovertebral hypertrophy. No spinal canal stenosis. Mild right neural foraminal narrowing. C4-C5: Disc height loss and minimal disc bulge. Facet and uncovertebral hypertrophy. No spinal canal stenosis. Severe left and mild right neural foraminal narrowing. C5-C6: Disc height loss. Facet and uncovertebral hypertrophy. No spinal canal stenosis. Severe left and moderate right neural foraminal narrowing. C6-C7: Left paracentral disc protrusion. Facet arthropathy. No spinal canal stenosis or neural foraminal narrowing. C7-T1: No significant disc bulge. Facet arthropathy. No spinal canal stenosis. Mild right neural foraminal narrowing. MRI THORACIC SPINE FINDINGS Alignment: Focal kyphosis centered on the T8, T9, and T10 vertebral body fractures. Otherwise normal alignment. Vertebrae: Redemonstrated destruction of the majority of the T9 vertebral body, for which only a portion of the posterior cortex is seen. The posterior cortex extends approximately 8 mm into the spinal canal. Significant loss of the T8 vertebral body, with only 60% vertebral body height at the anterior aspect and 50% of the vertebral body present at the posterior aspect. At the superior aspect of T10, there is approximately 20% vertebral body height loss. Decreased T1 and increased T2 signal is seen throughout the T8, T9, and T10 vertebral bodies, with  abnormal signal extending into posterior elements (series 19, image 5 and 17). Although postcontrast imaging is of limited utility, there appears to be diffuse enhancement in this area (series 24, image 10). Similar abnormal signal in the left third rib (series 10, image 20), which also appears to enhance (series 24, image 28). Cord: Mild cord deformation at T9, without abnormal spinal cord  signal. No abnormal enhancement. Paraspinal and other soft tissues: Masslike enhancement in the medial right lung, possibly at the right hilum (series 23, image 28). Disc levels: Moderate spinal canal stenosis posterior to T8-T9 through T9-T10, secondary to retropulsion (series 20, image 30). Severe bilateral neural foraminal narrowing at T8-T9 and T9-T10. MRI LUMBAR SPINE FINDINGS Segmentation:  5 lumbar type vertebral bodies. Alignment: Dextroscoliosis. Trace retrolisthesis of L1 on L2. Trace anterolisthesis of L4 on L5. Vertebrae: No acute fracture, evidence of discitis, or suspicious osseous lesion. Endplate degenerative changes, most prominent at L3-L4, eccentric to the left and at L5-S1, eccentric to the right. No abnormal enhancement. Congenitally short pedicles, which narrow the AP diameter of the spinal canal. Conus medullaris and cauda equina: Conus extends to the L2 level. Conus and cauda equina appear normal. Paraspinal and other soft tissues:  No acute finding. Disc levels: T12-L1: No significant disc bulge. No spinal canal stenosis or neural foraminal narrowing. L1-L2: No significant disc bulge. Mild facet arthropathy. Trace retrolisthesis. No spinal canal stenosis. Mild left neural foraminal narrowing. L2-L3: Minimal disc bulge. Mild facet arthropathy. No spinal canal stenosis or neural foraminal narrowing. L3-L4: Mild disc bulge. Mild-to-moderate facet arthropathy. Narrowing of the lateral recesses. Mild spinal canal stenosis. Moderate left and mild right neural foraminal narrowing. L4-L5: Trace anterolisthesis with disc unroofing and mild disc bulge. Severe facet arthropathy. Narrowing of the lateral recesses. Mild-to-moderate spinal canal stenosis. Mild bilateral neural foraminal narrowing. L5-S1: Minimal disc bulge with right eccentric disc osteophyte complex, which may contact the exiting right L5 nerve roots. Moderate right and mild left facet arthropathy. No spinal canal stenosis. Moderate  right neural foraminal narrowing. IMPRESSION: 1. Evaluation is somewhat limited by motion and the patient could not complete the postcontrast imaging. No post-contrast axial could be obtained on the cervical and lumbar spine. 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 No cord signal abnormality. 2. Abnormal signal and enhancement in the left third rib. Findings are concerning for additional site of metastatic disease. 3. Masslike enhancement in the medial right lung, possibly at the right hilum, concerning for a primary neoplastic process or metastatic disease. Recommend further evaluation with a CT of the chest with contrast. 4. L3-L4 mild spinal canal stenosis with moderate left and mild right neural foraminal narrowing. Narrowing of the lateral recesses at this level could affect the descending L4 nerve roots. 5. L4-L5 mild-to-moderate spinal canal stenosis and mild bilateral neural foraminal narrowing. Narrowing of the lateral recesses at this level could affect the descending L5 nerve roots. 6. L5-S1 moderate right neural foraminal narrowing. 7. C4-C5 severe left and mild right neural foraminal narrowing. 8. C5-C6 severe left and moderate right neural foraminal narrowing. Electronically Signed   By: Zoila Hines M.D.   On: 12/19/2023 14:24   MR Lumbar Spine W Wo Contrast Result Date: 12/19/2023 CLINICAL DATA:  Bone mass or bone pain, pathologic fracture on same-day CT thoracic spine EXAM: MRI CERVICAL, THORACIC AND LUMBAR SPINE WITHOUT AND WITH CONTRAST TECHNIQUE:  Multiplanar and multiecho pulse sequences of the cervical spine, to include the craniocervical junction and cervicothoracic junction, and thoracic and lumbar spine, were obtained without and with intravenous contrast. CONTRAST:  7mL GADAVIST  GADOBUTROL  1 MMOL/ML IV SOLN  COMPARISON:  08/09/2011 MRI lumbar spine, no prior MRI of the cervical or thoracic spine; correlation is made with CT thoracic and lumbar spine 12/19/2023; no prior CT of the cervical spine available. FINDINGS: Evaluation is somewhat limited by motion, particularly on the postcontrast images. In addition, the patient could not complete the postcontrast imaging. No post-contrast axial could be obtained on the cervical and lumbar spine. MRI CERVICAL SPINE FINDINGS Alignment: No significant listhesis. Vertebrae: No acute fracture, evidence of discitis, or suspicious osseous lesion. Cord: Normal in signal and morphology. Posterior Fossa, vertebral arteries, paraspinal tissues: Low lying cerebellar tonsils, which extend approximately 4 mm below the foramen magnum and do not meet criteria for Chiari 1 malformation. Normal vertebral artery flow voids. Disc levels: C2-C3: No significant disc bulge. No spinal canal stenosis or neuroforaminal narrowing. C3-C4: No significant disc bulge. Uncovertebral hypertrophy. No spinal canal stenosis. Mild right neural foraminal narrowing. C4-C5: Disc height loss and minimal disc bulge. Facet and uncovertebral hypertrophy. No spinal canal stenosis. Severe left and mild right neural foraminal narrowing. C5-C6: Disc height loss. Facet and uncovertebral hypertrophy. No spinal canal stenosis. Severe left and moderate right neural foraminal narrowing. C6-C7: Left paracentral disc protrusion. Facet arthropathy. No spinal canal stenosis or neural foraminal narrowing. C7-T1: No significant disc bulge. Facet arthropathy. No spinal canal stenosis. Mild right neural foraminal narrowing. MRI THORACIC SPINE FINDINGS Alignment: Focal kyphosis centered on the T8, T9, and T10 vertebral body fractures. Otherwise normal alignment. Vertebrae: Redemonstrated destruction of the majority of the T9 vertebral body, for which only a portion of the posterior cortex is seen. The posterior cortex extends  approximately 8 mm into the spinal canal. Significant loss of the T8 vertebral body, with only 60% vertebral body height at the anterior aspect and 50% of the vertebral body present at the posterior aspect. At the superior aspect of T10, there is approximately 20% vertebral body height loss. Decreased T1 and increased T2 signal is seen throughout the T8, T9, and T10 vertebral bodies, with abnormal signal extending into posterior elements (series 19, image 5 and 17). Although postcontrast imaging is of limited utility, there appears to be diffuse enhancement in this area (series 24, image 10). Similar abnormal signal in the left third rib (series 10, image 20), which also appears to enhance (series 24, image 28). Cord: Mild cord deformation at T9, without abnormal spinal cord signal. No abnormal enhancement. Paraspinal and other soft tissues: Masslike enhancement in the medial right lung, possibly at the right hilum (series 23, image 28). Disc levels: Moderate spinal canal stenosis posterior to T8-T9 through T9-T10, secondary to retropulsion (series 20, image 30). Severe bilateral neural foraminal narrowing at T8-T9 and T9-T10. MRI LUMBAR SPINE FINDINGS Segmentation:  5 lumbar type vertebral bodies. Alignment: Dextroscoliosis. Trace retrolisthesis of L1 on L2. Trace anterolisthesis of L4 on L5. Vertebrae: No acute fracture, evidence of discitis, or suspicious osseous lesion. Endplate degenerative changes, most prominent at L3-L4, eccentric to the left and at L5-S1, eccentric to the right. No abnormal enhancement. Congenitally short pedicles, which narrow the AP diameter of the spinal canal. Conus medullaris and cauda equina: Conus extends to the L2 level. Conus and cauda equina appear normal. Paraspinal and other soft tissues:  No acute finding. Disc levels: T12-L1: No significant disc bulge. No  spinal canal stenosis or neural foraminal narrowing. L1-L2: No significant disc bulge. Mild facet arthropathy. Trace  retrolisthesis. No spinal canal stenosis. Mild left neural foraminal narrowing. L2-L3: Minimal disc bulge. Mild facet arthropathy. No spinal canal stenosis or neural foraminal narrowing. L3-L4: Mild disc bulge. Mild-to-moderate facet arthropathy. Narrowing of the lateral recesses. Mild spinal canal stenosis. Moderate left and mild right neural foraminal narrowing. L4-L5: Trace anterolisthesis with disc unroofing and mild disc bulge. Severe facet arthropathy. Narrowing of the lateral recesses. Mild-to-moderate spinal canal stenosis. Mild bilateral neural foraminal narrowing. L5-S1: Minimal disc bulge with right eccentric disc osteophyte complex, which may contact the exiting right L5 nerve roots. Moderate right and mild left facet arthropathy. No spinal canal stenosis. Moderate right neural foraminal narrowing. IMPRESSION: 1. Evaluation is somewhat limited by motion and the patient could not complete the postcontrast imaging. No post-contrast axial could be obtained on the cervical and lumbar spine. 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 No cord signal abnormality. 2. Abnormal signal and enhancement in the left third rib. Findings are concerning for additional site of metastatic disease. 3. Masslike enhancement in the medial right lung, possibly at the right hilum, concerning for a primary neoplastic process or metastatic disease. Recommend further evaluation with a CT of the chest with contrast. 4. L3-L4 mild spinal canal stenosis with moderate left and mild right neural foraminal narrowing. Narrowing of the lateral recesses at this level could affect the descending L4 nerve roots. 5. L4-L5 mild-to-moderate spinal canal stenosis and mild bilateral neural foraminal narrowing. Narrowing of the lateral recesses at this level could affect the  descending L5 nerve roots. 6. L5-S1 moderate right neural foraminal narrowing. 7. C4-C5 severe left and mild right neural foraminal narrowing. 8. C5-C6 severe left and moderate right neural foraminal narrowing. Electronically Signed   By: Zoila Hines M.D.   On: 12/19/2023 14:24   CT Lumbar Spine Wo Contrast Result Date: 12/19/2023 CLINICAL DATA:  Low back pain. Cancer suspected. Pain over the last 2 months. EXAM: CT LUMBAR SPINE WITHOUT CONTRAST TECHNIQUE: Multidetector CT imaging of the lumbar spine was performed without intravenous contrast administration. Multiplanar CT image reconstructions were also generated. RADIATION DOSE REDUCTION: This exam was performed according to the departmental dose-optimization program which includes automated exposure control, adjustment of the mA and/or kV according to patient size and/or use of iterative reconstruction technique. COMPARISON:  Thoracic CT same day FINDINGS: Segmentation: 5 lumbar type vertebral bodies. Alignment: Early Od a curvature convex to the right with the apex at L3. Degenerative anterolisthesis at L4-5 of 4 mm. Vertebrae: Degenerative endplate changes on the left at L3-4 and on the right at L4-5 and L5-S1 with sclerotic and early cystic changes. Cystic arthropathy present affecting the facet joints at L2-3, L3-4, L4-5 and L5-S1 as below. Paraspinal and other soft tissues: Negative. No evidence of adenopathy or mass lesion. Disc levels: T12-L1: Normal interspace. L1-2: Mild bulging of the disc more towards the left. No compressive stenosis. L2-3: Disc degeneration more pronounced on the left. Mild bulging of the disc. No compressive stenosis. Mild facet arthropathy on the left. L3-4: Disc degeneration more pronounced on the left with disc space narrowing, sclerotic endplate changes and underlying cystic endplate changes. Circumferential bulging of the disc. Bilateral facet arthropathy with cystic changes. Moderate multifactorial spinal stenosis at this  level that could be symptomatic, particularly on the  left. L4-5: Facet arthropathy with cystic changes, worse on the right. Anterolisthesis of 4 mm. Disc degeneration more pronounced on the right with sclerotic and cystic endplate changes. Stenosis of the canal and lateral recesses that could cause neural compression, more likely on the right. L5-S1: Disc degeneration more pronounced on the right with vacuum phenomenon, endplate sclerotic changes and cystic changes. Facet arthropathy on the right with cystic changes. No central canal stenosis. Right foraminal stenosis that could affect the exiting right L5 nerve. IMPRESSION: 1. No evidence of malignancy. 2. Scoliosis convex to the right. 3. L3-4: Disc degeneration more pronounced on the left with disc space narrowing, sclerotic endplate changes and underlying cystic endplate changes. Circumferential bulging of the disc. Bilateral facet arthropathy with cystic changes. Moderate multifactorial spinal stenosis at this level that could be symptomatic, particularly on the left. 4. L4-5: Facet arthropathy with cystic changes, worse on the right. Anterolisthesis of 4 mm. Disc degeneration more pronounced on the right with sclerotic and cystic endplate changes. Stenosis of the canal and lateral recesses that could cause neural compression, more likely on the right. 5. L5-S1: Disc degeneration more pronounced on the right with vacuum phenomenon, endplate sclerotic changes and cystic changes. Facet arthropathy on the right with cystic changes. Right foraminal stenosis that could affect the exiting right L5 nerve. 6. The appearance of the endplate degenerative changes and the facet arthropathy is unusual for a person of this age, with prominent cystic changes. Findings could be seen with a systemic arthropathy such as CPPD or gout. The findings in this region are not specifically suggestive of tuberculosis of the spine as was suggested in the thoracic region. Electronically  Signed   By: Bettylou Brunner M.D.   On: 12/19/2023 11:37   CT Thoracic Spine Wo Contrast Result Date: 12/19/2023 CLINICAL DATA:  Bone lesion, thoracic spine, incidental. Back pain over the last 2 months. EXAM: CT THORACIC SPINE WITHOUT CONTRAST TECHNIQUE: Multidetector CT images of the thoracic were obtained using the standard protocol without intravenous contrast. RADIATION DOSE REDUCTION: This exam was performed according to the departmental dose-optimization program which includes automated exposure control, adjustment of the mA and/or kV according to patient size and/or use of iterative reconstruction technique. COMPARISON:  Lumbar MRI 08/09/2011 FINDINGS: Alignment: Kyphosis of the thoracic spine with the apex at T9-10, 55 degrees by measurement. Vertebrae: Lytic destruction of the T9 vertebral body. Posterior bowing of the posterior margin of the vertebral body. Lytic destruction of the inferior aspect of the T8 vertebral body in the superior aspect of the T10 vertebral body. There is an acute fracture of the right posterior eighth rib. There is a fracture of the transverse process on the right at T8. Paraspinal and other soft tissues: Some inflammatory changes of the posterior mediastinal soft tissues in the region from T8-T10. I do not identify definite soft tissue canal compromise, but MRI of the thoracic spine is recommended with and without contrast. Patient has obstruction/occlusion of the right middle lobe and lower lobe bronchi with volume loss of the lung in those regions. Disc levels: No significant abnormality seen from T1 through T7 and from the T10-11 disc level to the lumbar region. IMPRESSION: 1. Lytic destruction of the T9 vertebral body with posterior bowing of the posterior margin of the vertebral body. Lytic destruction of the inferior aspect of the T8 vertebral body and the superior aspect of the T10 vertebral body. Acute fracture of the right posterior eighth rib. Fracture of the  transverse process on the  right at T8. Some surrounding soft tissue inflammatory change within the posterior mediastinum but no evidence of low-density abscess formation. Lung disease affecting the right middle and lower lobes. 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 This is a rare diagnosis and the differential diagnosis would also include metastatic disease and myeloma/plasmacytoma. Pyogenic spinal infection is less likely given the absence of an elevated white count and the absence of more pronounced surrounding soft tissue changes. Electronically Signed   By: Bettylou Brunner M.D.   On: 12/19/2023 11:24    Labs:  CBC: Recent Labs    12/18/23 0926 12/19/23 1220 12/20/23 0625  WBC 9.3 9.3 9.6  HGB 10.4* 10.1* 11.3*  HCT 32.5* 31.6* 34.1*  PLT 289 292 339    COAGS: No results for input(s): "INR", "APTT" in the last 8760 hours.  BMP: Recent Labs    12/18/23 0926 12/19/23 1220 12/20/23 0625  NA 136 136 132*  K 3.8 3.9 3.8  CL 102 99 97*  CO2 23 23 23   GLUCOSE 114* 75 97  BUN 29* 14 11  CALCIUM 8.7* 8.8* 8.8*  CREATININE 0.84 0.71 0.63  GFRNONAA >60 >60 >60    LIVER FUNCTION TESTS: Recent Labs    12/18/23 0926 12/19/23 1220  BILITOT 0.7 0.7  AST 23 21  ALT 19 16  ALKPHOS 62 65  PROT 7.4 7.1  ALBUMIN 3.3* 3.2*    TUMOR MARKERS: No results for input(s): "AFPTM", "CEA", "CA199", "CHROMGRNA" in the last 8760 hours.  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  IR consulted for biopsy.  Case reviewed and approved by Dr. Marlena Sima.  Mr. Toronto has been NPO.  He is aware of prone positioning required for biopsy.  Plan for procedure with moderate sedation if patient able to tolerate.  Risks and benefits were discussed with the patient and/or patient's family including, but not limited to bleeding, infection, damage  to adjacent structures or low yield requiring additional tests.  All of the questions were answered and there is agreement to proceed.  Consent signed and in chart    Thank you for this interesting consult.  I greatly enjoyed meeting PRESTAN BRAND and look forward to participating in their care.  A copy of this report was sent to the requesting provider on this date.  Electronically Signed: Holley Wirt Sue-Ellen Bearl Talarico, PA 12/20/2023, 2:17 PM   I spent a total of 20 Minutes    in face to face in clinical consultation, greater than 50% of which was counseling/coordinating care for back pain, T9 spine lesion.

## 2023-12-20 NOTE — Progress Notes (Signed)
 PHARMACY - PHYSICIAN COMMUNICATION CRITICAL VALUE ALERT - BLOOD CULTURE IDENTIFICATION (BCID)  Ruben Turner is an 48 y.o. male who presented to [facility] on 12/19/2023 with a chief complaint of back pain  Assessment:  1/13 blood culture with GPC, BCID detects Staphylococcus species (NOT S aureus)  Name of physician (or Provider) Contacted: Dr Mason Sole  Current antibiotics: none  Changes to prescribed antibiotics recommended:  Withhold antibiotics pending work-up of lytic spinal lesion   Results for orders placed or performed during the hospital encounter of 12/19/23  Blood Culture ID Panel (Reflexed) (Collected: 12/19/2023 12:30 PM)  Result Value Ref Range   Enterococcus faecalis NOT DETECTED NOT DETECTED   Enterococcus Faecium NOT DETECTED NOT DETECTED   Listeria monocytogenes NOT DETECTED NOT DETECTED   Staphylococcus species DETECTED (A) NOT DETECTED   Staphylococcus aureus (BCID) NOT DETECTED NOT DETECTED   Staphylococcus epidermidis NOT DETECTED NOT DETECTED   Staphylococcus lugdunensis NOT DETECTED NOT DETECTED   Streptococcus species NOT DETECTED NOT DETECTED   Streptococcus agalactiae NOT DETECTED NOT DETECTED   Streptococcus pneumoniae NOT DETECTED NOT DETECTED   Streptococcus pyogenes NOT DETECTED NOT DETECTED   A.calcoaceticus-baumannii NOT DETECTED NOT DETECTED   Bacteroides fragilis NOT DETECTED NOT DETECTED   Enterobacterales NOT DETECTED NOT DETECTED   Enterobacter cloacae complex NOT DETECTED NOT DETECTED   Escherichia coli NOT DETECTED NOT DETECTED   Klebsiella aerogenes NOT DETECTED NOT DETECTED   Klebsiella oxytoca NOT DETECTED NOT DETECTED   Klebsiella pneumoniae NOT DETECTED NOT DETECTED   Proteus species NOT DETECTED NOT DETECTED   Salmonella species NOT DETECTED NOT DETECTED   Serratia marcescens NOT DETECTED NOT DETECTED   Haemophilus influenzae NOT DETECTED NOT DETECTED   Neisseria meningitidis NOT DETECTED NOT DETECTED   Pseudomonas aeruginosa  NOT DETECTED NOT DETECTED   Stenotrophomonas maltophilia NOT DETECTED NOT DETECTED   Candida albicans NOT DETECTED NOT DETECTED   Candida auris NOT DETECTED NOT DETECTED   Candida glabrata NOT DETECTED NOT DETECTED   Candida krusei NOT DETECTED NOT DETECTED   Candida parapsilosis NOT DETECTED NOT DETECTED   Candida tropicalis NOT DETECTED NOT DETECTED   Cryptococcus neoformans/gattii NOT DETECTED NOT DETECTED   Valma Gazella, PharmD, BCPS, BCIDP Work Cell: [phone] 12/20/2023 12:16 PM

## 2023-12-20 NOTE — Progress Notes (Signed)
 Patient continuing to ask for additional pain meds. States he is getting no relief. Patient was given prn sleep aid that he requested earlier as well. Patient did rest for about 20 mins. Patient given recliner at his request to see if that would be more comfortable than the bed. Patient still refusing to wear tele or pulse ox due to moving around room continuously due to being uncomfortable.

## 2023-12-20 NOTE — Progress Notes (Signed)
 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 When asked if he shared that with dr. Arnett when she was at the bedside earlier he states he forgot. Sent message to her via secure chat to let her know. She responded ok. Patient reports he takes 90mg  and he had his dose today at the clinic prior to arriving at the ED. Provider made aware that patient had dose today.

## 2023-12-20 NOTE — ED Notes (Signed)
Tech at bedside placing TLSO

## 2023-12-20 NOTE — Progress Notes (Signed)
 Orthopedic Tech Progress Note Patient Details:  Ruben Turner 08-Feb-1976 [id number]  Patient ID: [name], male   DOB: 1976-11-30, 48 y.o.   MRN: [id number] Order placed with hanger for TLSO. Toi Foster 12/20/2023, 6:41 AM

## 2023-12-20 NOTE — Procedures (Signed)
  Procedure:  CT aspiration/biopsy thoracic T9-10 interspace   Preprocedure diagnosis: The encounter diagnosis was Pathological fracture of thoracic vertebra, initial encounter. Postprocedure diagnosis: same EBL:    minimal Complications:   none immediate  See full dictation in [employer].  Nicky Barrack MD Main # [phone] Pager  [phone] Mobile [phone]

## 2023-12-20 NOTE — Progress Notes (Signed)
 Ortho tech at [facility] notified of need for TLSO.

## 2023-12-20 NOTE — Sedation Documentation (Signed)
 Multiple CT images done by MD & tech. MD reviewing.

## 2023-12-20 NOTE — Sedation Documentation (Signed)
 Drsg. Applied to mid thoracic spine area by Wyvonnia Heimlich, tech. Pt. Tolerated well.

## 2023-12-20 NOTE — Progress Notes (Signed)
 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 Randy Buttery will see him on Friday TLSO brace for stabilization for now 1/4 blood culture bottle growing gram-positive cocci.  BC ID detecting staph species.  Holding antibiotics for now  Pulmonary mass Initially seen on imaging but CT chest/abdomen/pelvis not confirming same. Pending oncology consult  Opioid use disorder Patient reports active intravenous fentanyl  use.  He shared with me that he did IV heroin about a week ago   - HIV and hepatitis panel pending - Prior to discharge, will benefit from [facility] provided resources - Resume methadone  that he takes at home -His pain control has been challenging despite good dose of methadone , oxycodone , fentanyl  and Toradol .  I will consult palliative care to see if they can help with pain management while in the hospital  DVT prophylaxis:  SCDs Start: 12/19/23 1423     Code Status: Full code Family Communication: (Specify name,  relationship & date discussed. NO "discussed with patient") Disposition Plan: (specify when and where you expect patient to be discharged). Include barriers to DC in this tab.   Consultants:  Neurosurgery, IR  Procedures:  CT aspiration/biopsy thoracic T9-10 interspace    Subjective:  Requesting his methadone  when I saw him.  Agreeable to get IR guided biopsy  Objective: Vitals:   12/20/23 1545 12/20/23 1550 12/20/23 1612 12/20/23 1630  BP: 120/88 120/88 (!) 151/90 (!) 143/99  Pulse: 84 84 91 (!) 107  Resp: 16 14 15 17   Temp:      TempSrc:      SpO2: 96% 96% 96% 97%  Weight:      Height:        Intake/Output Summary (Last 24 hours) at 12/20/2023 1653 Last data filed at 12/20/2023 1134 Gross per 24 hour  Intake 3 ml  Output --  Net 3 ml   Filed Weights   12/19/23 0548  Weight: 77.1 kg    Examination:  General exam: Appears calm and comfortable  Respiratory system: Clear to auscultation. Respiratory effort normal. Cardiovascular system: S1 & S2 heard, RRR. No JVD, murmurs, rubs, gallops or clicks. No pedal edema. Gastrointestinal system: Abdomen is nondistended, soft and nontender. No organomegaly or masses felt. Normal bowel sounds heard. Central nervous system: Alert and oriented. No focal neurological deficits. Extremities: Symmetric 5 x 5 power.  Has kyphotic deformity in the thoracic spine Skin: No rashes, lesions or ulcers Psychiatry: Judgement and insight appear normal. Mood & affect appropriate.     Data Reviewed: I have personally reviewed following labs  and imaging studies  CBC: Recent Labs  Lab 12/18/23 0926 12/19/23 1220 12/20/23 0625  WBC 9.3 9.3 9.6  NEUTROABS  --  6.5 7.8*  HGB 10.4* 10.1* 11.3*  HCT 32.5* 31.6* 34.1*  MCV 86.7 86.1 82.6  PLT 289 292 339   Basic Metabolic Panel: Recent Labs  Lab 12/18/23 0926 12/19/23 1220 12/20/23 0625  NA 136 136 132*  K 3.8 3.9 3.8  CL 102 99 97*  CO2 23 23 23   GLUCOSE 114* 75 97  BUN 29*  14 11  CREATININE 0.84 0.71 0.63  CALCIUM 8.7* 8.8* 8.8*   GFR: Estimated Creatinine Clearance: 121.6 mL/min (by C-G formula based on SCr of 0.63 mg/dL). Liver Function Tests: Recent Labs  Lab 12/18/23 0926 12/19/23 1220  AST 23 21  ALT 19 16  ALKPHOS 62 65  BILITOT 0.7 0.7  PROT 7.4 7.1  ALBUMIN 3.3* 3.2*   Recent Labs  Lab 12/18/23 0926  LIPASE 22    Sepsis Labs: Recent Labs  Lab 12/19/23 1220  LATICACIDVEN 0.7    Recent Results (from the past 240 hours)  Blood culture (routine x 2)     Status: None (Preliminary result)   Collection Time: 12/19/23 12:20 PM   Specimen: BLOOD  Result Value Ref Range Status   Specimen Description BLOOD BLOOD LEFT FOREARM  Final   Special Requests   Final    BOTTLES DRAWN AEROBIC AND ANAEROBIC Blood Culture results may not be optimal due to an inadequate volume of blood received in culture bottles   Culture   Final    NO GROWTH < 24 HOURS Performed at [facility], [address], [place], [place] [address]    Report Status PENDING  Incomplete  Blood culture (routine x 2)     Status: None (Preliminary result)   Collection Time: 12/19/23 12:30 PM   Specimen: BLOOD RIGHT HAND  Result Value Ref Range Status   Specimen Description   Final    BLOOD RIGHT HAND Performed at [facility], [address], [place], [place] [address]    Special Requests   Final    BOTTLES DRAWN AEROBIC AND ANAEROBIC Blood Culture results may not be optimal due to an inadequate volume of blood received in culture bottles Performed at [facility], [address] [place], [place] [address]    Culture  Setup Time   Final    GRAM POSITIVE COCCI ANAEROBIC BOTTLE ONLY CRITICAL RESULT CALLED TO, READ BACK BY AND VERIFIED WITH: Lafrances Pigeon [facility] 2130 12/20/23 HNM Performed at [facility], [address] [place], [place] [address]    Culture GRAM POSITIVE COCCI  Final   Report Status PENDING  Incomplete  Blood Culture ID  Panel (Reflexed)     Status: Abnormal   Collection Time: 12/19/23 12:30 PM  Result Value Ref Range Status   Enterococcus faecalis NOT DETECTED NOT DETECTED Final   Enterococcus Faecium NOT DETECTED NOT DETECTED Final   Listeria monocytogenes NOT DETECTED NOT DETECTED Final   Staphylococcus species DETECTED (A) NOT DETECTED Final    Comment: CRITICAL RESULT CALLED TO, READ BACK BY AND VERIFIED WITH: WILL ANDERSON PHARMD 1158 12/20/23 HNM    Staphylococcus aureus (BCID) NOT DETECTED NOT DETECTED Final   Staphylococcus epidermidis NOT DETECTED NOT DETECTED Final   Staphylococcus lugdunensis NOT DETECTED NOT DETECTED Final   Streptococcus species NOT DETECTED NOT DETECTED Final   Streptococcus agalactiae NOT DETECTED NOT DETECTED Final   Streptococcus pneumoniae  NOT DETECTED NOT DETECTED Final   Streptococcus pyogenes NOT DETECTED NOT DETECTED Final   A.calcoaceticus-baumannii NOT DETECTED NOT DETECTED Final   Bacteroides fragilis NOT DETECTED NOT DETECTED Final   Enterobacterales NOT DETECTED NOT DETECTED Final   Enterobacter cloacae complex NOT DETECTED NOT DETECTED Final   Escherichia coli NOT DETECTED NOT DETECTED Final   Klebsiella aerogenes NOT DETECTED NOT DETECTED Final   Klebsiella oxytoca NOT DETECTED NOT DETECTED Final   Klebsiella pneumoniae NOT DETECTED NOT DETECTED Final   Proteus species NOT DETECTED NOT DETECTED Final   Salmonella species NOT DETECTED NOT DETECTED Final   Serratia marcescens NOT DETECTED NOT DETECTED Final   Haemophilus influenzae NOT DETECTED NOT DETECTED Final   Neisseria meningitidis NOT DETECTED NOT DETECTED Final   Pseudomonas aeruginosa NOT DETECTED NOT DETECTED Final   Stenotrophomonas maltophilia NOT DETECTED NOT DETECTED Final   Candida albicans NOT DETECTED NOT DETECTED Final   Candida auris NOT DETECTED NOT DETECTED Final   Candida glabrata NOT DETECTED NOT DETECTED Final   Candida krusei NOT DETECTED NOT DETECTED Final   Candida parapsilosis  NOT DETECTED NOT DETECTED Final   Candida tropicalis NOT DETECTED NOT DETECTED Final   Cryptococcus neoformans/gattii NOT DETECTED NOT DETECTED Final    Comment: Performed at [facility], [address] [place], [place] [address]         Radiology Studies: CT BONE TROCAR/NEEDLE BIOPSY DEEP Result Date: 12/20/2023 CLINICAL DATA:  Collapse of T9 with acute kyphosis T9-10, bony destruction of T9 with posterior displacement of fracture fragments EXAM: CT GUIDED DEEP  BONE ASPIRATION AND CORE BIOPSY TECHNIQUE: Patient was placed prone on the CT gantry and limited axial scans through the thorax were obtained. This exam was performed according to the departmental dose-optimization program which includes automated exposure control, adjustment of the mA and/or kV according to patient size and/or use of iterative reconstruction technique. Appropriate skin entry site was identified. Skin site was marked, prepped with chlorhexidine, draped in usual sterile fashion, and infiltrated locally with 1% lidocaine . Intravenous Fentanyl  75mcg and Versed  1.5mg  were administered by RN during a total moderate (conscious) sedation time of 26 minutes; the patient's level of consciousness and physiological / cardiorespiratory status were monitored continuously by radiology RN under my direct supervision. Under angled CT fluoroscopic guidance, initially a 22 gauge spinal needle was advanced into the T9-10 interspace from a right posterolateral approach in the region of bony destruction. A scant amount of bloody fluid was aspirated, sent for Gram stain and culture. Using the same approach, an 11-gauge Cook trocar bone needle was advanced into the T9-10 interspace posterior margin. Coaxial was obtained, also sent for Gram stain and culture. Subsequently, bone biopsy using the outer 11 gauge trocar needle was obtained, submitted to surgical pathology. Postprocedure scans show no hemorrhage or other apparent complication.  No pneumothorax. Patient tolerated procedure well. COMPLICATIONS: COMPLICATIONS none IMPRESSION: 1. Technically successful CT guided thoracic spine T9-10 core and aspiration bone biopsy, sent for surgical pathology, Gram stain and culture. Electronically Signed   By: Nicoletta Barrier M.D.   On: 12/20/2023 16:21   CT CHEST ABDOMEN PELVIS W CONTRAST Result Date: 12/19/2023 CLINICAL DATA:  Back pain.  Evaluation of metastatic disease. EXAM: CT CHEST, ABDOMEN, AND PELVIS WITH CONTRAST TECHNIQUE: Multidetector CT imaging of the chest, abdomen and pelvis was performed following the standard protocol during bolus administration of intravenous contrast. RADIATION DOSE REDUCTION: This exam was performed according to the departmental dose-optimization program which includes automated exposure control, adjustment  of the mA and/or kV according to patient size and/or use of iterative reconstruction technique. CONTRAST:  OMNIPAQUE  IOHEXOL  300 MG/ML  SOLN COMPARISON:  Thoracic and lumbar spine CT and MRI dated 12/19/2023. FINDINGS: CT CHEST FINDINGS Cardiovascular: There is no cardiomegaly or pericardial effusion. The thoracic aorta is unremarkable. The origins of the great vessels of the aortic arch and the central pulmonary arteries appear patent. Mediastinum/Nodes: No hilar or mediastinal adenopathy. The esophagus is grossly unremarkable. No mediastinal fluid collection. Lungs/Pleura: There is right infrahilar peribronchial thickening and airspace density in the right lower lobe concerning for bronchitis and pneumonia. An underlying mass is less likely but not excluded. Clinical correlation and follow-up to resolution recommended. There is a 6 mm subpleural nodule or para fissural lymph node along the left fissure. Additional nodules along the right major fissure measure up to 4 mm. No pleural effusion or pneumothorax. The central airways remain patent. Musculoskeletal: Degenerative changes of the shoulders. Minimally  displaced fracture of the posterior right eighth rib. Nondisplaced fracture of the right T8 spinous process. Destructive changes and complete collapse of T9 with posterior buckling. See report of the CT and MRI of the spine for detailed evaluation. CT ABDOMEN PELVIS FINDINGS No intra-abdominal free air or free fluid. Hepatobiliary: Subcentimeter hepatic hypodense lesions are too small to characterize. No biliary dilatation. The gallbladder is unremarkable. Pancreas: The pancreas is grossly unremarkable as visualized. Spleen: Normal in size without focal abnormality. Adrenals/Urinary Tract: The adrenal glands are unremarkable. The kidneys, visualized ureters, and urinary bladder appear unremarkable. Stomach/Bowel: Evaluation of the bowel is limited in the absence of oral contrast and due to paucity of intra-abdominal fat. There is moderate amount of stool throughout the colon. There is no bowel obstruction or active inflammation. The appendix is not visualized with certainty. No inflammatory changes identified in the right lower quadrant. Vascular/Lymphatic: Mild atherosclerotic calcification of the abdominal aorta. The IVC is unremarkable. No portal venous gas. There is no adenopathy. Reproductive: The prostate and seminal vesicles are grossly unremarkable. No pelvic mass. Other: There is loss of subcutaneous fat and cachexia. Musculoskeletal: Degenerative changes of the spine and multilevel disc desiccation and vacuum phenomena. No acute osseous pathology. IMPRESSION: 1. Right infrahilar peribronchial thickening and airspace density in the right lower lobe concerning for bronchitis and pneumonia. An underlying mass is less likely but not excluded. Clinical correlation and follow-up to resolution recommended. 2. Minimally displaced fracture of the posterior right eighth rib. Nondisplaced fracture of the right T8 spinous process. 3. Destructive changes and complete collapse of T9 with posterior buckling. See reports  for the CT and MRI of the spine. 4. No acute intra-abdominal or pelvic pathology. 5. Moderate colonic stool burden. No bowel obstruction. 6.  Aortic Atherosclerosis (ICD10-I70.0). Electronically Signed   By: Angus Bark M.D.   On: 12/19/2023 17:05   MR Cervical Spine W and Wo Contrast Result Date: 12/19/2023 CLINICAL DATA:  Bone mass or bone pain, pathologic fracture on same-day CT thoracic spine EXAM: MRI CERVICAL, THORACIC AND LUMBAR SPINE WITHOUT AND WITH CONTRAST TECHNIQUE: Multiplanar and multiecho pulse sequences of the cervical spine, to include the craniocervical junction and cervicothoracic junction, and thoracic and lumbar spine, were obtained without and with intravenous contrast. CONTRAST:  7mL GADAVIST  GADOBUTROL  1 MMOL/ML IV SOLN COMPARISON:  08/09/2011 MRI lumbar spine, no prior MRI of the cervical or thoracic spine; correlation is made with CT thoracic and lumbar spine 12/19/2023; no prior CT of the cervical spine available. FINDINGS: Evaluation is somewhat limited by  motion, particularly on the postcontrast images. In addition, the patient could not complete the postcontrast imaging. No post-contrast axial could be obtained on the cervical and lumbar spine. MRI CERVICAL SPINE FINDINGS Alignment: No significant listhesis. Vertebrae: No acute fracture, evidence of discitis, or suspicious osseous lesion. Cord: Normal in signal and morphology. Posterior Fossa, vertebral arteries, paraspinal tissues: Low lying cerebellar tonsils, which extend approximately 4 mm below the foramen magnum and do not meet criteria for Chiari 1 malformation. Normal vertebral artery flow voids. Disc levels: C2-C3: No significant disc bulge. No spinal canal stenosis or neuroforaminal narrowing. C3-C4: No significant disc bulge. Uncovertebral hypertrophy. No spinal canal stenosis. Mild right neural foraminal narrowing. C4-C5: Disc height loss and minimal disc bulge. Facet and uncovertebral hypertrophy. No spinal canal  stenosis. Severe left and mild right neural foraminal narrowing. C5-C6: Disc height loss. Facet and uncovertebral hypertrophy. No spinal canal stenosis. Severe left and moderate right neural foraminal narrowing. C6-C7: Left paracentral disc protrusion. Facet arthropathy. No spinal canal stenosis or neural foraminal narrowing. C7-T1: No significant disc bulge. Facet arthropathy. No spinal canal stenosis. Mild right neural foraminal narrowing. MRI THORACIC SPINE FINDINGS Alignment: Focal kyphosis centered on the T8, T9, and T10 vertebral body fractures. Otherwise normal alignment. Vertebrae: Redemonstrated destruction of the majority of the T9 vertebral body, for which only a portion of the posterior cortex is seen. The posterior cortex extends approximately 8 mm into the spinal canal. Significant loss of the T8 vertebral body, with only 60% vertebral body height at the anterior aspect and 50% of the vertebral body present at the posterior aspect. At the superior aspect of T10, there is approximately 20% vertebral body height loss. Decreased T1 and increased T2 signal is seen throughout the T8, T9, and T10 vertebral bodies, with abnormal signal extending into posterior elements (series 19, image 5 and 17). Although postcontrast imaging is of limited utility, there appears to be diffuse enhancement in this area (series 24, image 10). Similar abnormal signal in the left third rib (series 10, image 20), which also appears to enhance (series 24, image 28). Cord: Mild cord deformation at T9, without abnormal spinal cord signal. No abnormal enhancement. Paraspinal and other soft tissues: Masslike enhancement in the medial right lung, possibly at the right hilum (series 23, image 28). Disc levels: Moderate spinal canal stenosis posterior to T8-T9 through T9-T10, secondary to retropulsion (series 20, image 30). Severe bilateral neural foraminal narrowing at T8-T9 and T9-T10. MRI LUMBAR SPINE FINDINGS Segmentation:  5 lumbar  type vertebral bodies. Alignment: Dextroscoliosis. Trace retrolisthesis of L1 on L2. Trace anterolisthesis of L4 on L5. Vertebrae: No acute fracture, evidence of discitis, or suspicious osseous lesion. Endplate degenerative changes, most prominent at L3-L4, eccentric to the left and at L5-S1, eccentric to the right. No abnormal enhancement. Congenitally short pedicles, which narrow the AP diameter of the spinal canal. Conus medullaris and cauda equina: Conus extends to the L2 level. Conus and cauda equina appear normal. Paraspinal and other soft tissues:  No acute finding. Disc levels: T12-L1: No significant disc bulge. No spinal canal stenosis or neural foraminal narrowing. L1-L2: No significant disc bulge. Mild facet arthropathy. Trace retrolisthesis. No spinal canal stenosis. Mild left neural foraminal narrowing. L2-L3: Minimal disc bulge. Mild facet arthropathy. No spinal canal stenosis or neural foraminal narrowing. L3-L4: Mild disc bulge. Mild-to-moderate facet arthropathy. Narrowing of the lateral recesses. Mild spinal canal stenosis. Moderate left and mild right neural foraminal narrowing. L4-L5: Trace anterolisthesis with disc unroofing and mild disc bulge. Severe facet  arthropathy. Narrowing of the lateral recesses. Mild-to-moderate spinal canal stenosis. Mild bilateral neural foraminal narrowing. L5-S1: Minimal disc bulge with right eccentric disc osteophyte complex, which may contact the exiting right L5 nerve roots. Moderate right and mild left facet arthropathy. No spinal canal stenosis. Moderate right neural foraminal narrowing. IMPRESSION: 1. Evaluation is somewhat limited by motion and the patient could not complete the postcontrast imaging. No post-contrast axial could be obtained on the cervical and lumbar spine. Within this limitation, redemonstrated presumed metastatic lesions in T8, T9, and T10, with near complete collapse of T9 and 8 mm retropulsion the posterior cortex, which results in  moderate spinal canal stenosis. No cord signal abnormality. 2. Abnormal signal and enhancement in the left third rib. Findings are concerning for additional site of metastatic disease. 3. Masslike enhancement in the medial right lung, possibly at the right hilum, concerning for a primary neoplastic process or metastatic disease. Recommend further evaluation with a CT of the chest with contrast. 4. L3-L4 mild spinal canal stenosis with moderate left and mild right neural foraminal narrowing. Narrowing of the lateral recesses at this level could affect the descending L4 nerve roots. 5. L4-L5 mild-to-moderate spinal canal stenosis and mild bilateral neural foraminal narrowing. Narrowing of the lateral recesses at this level could affect the descending L5 nerve roots. 6. L5-S1 moderate right neural foraminal narrowing. 7. C4-C5 severe left and mild right neural foraminal narrowing. 8. C5-C6 severe left and moderate right neural foraminal narrowing. Electronically Signed   By: Zoila Hines M.D.   On: 12/19/2023 14:24   MR THORACIC SPINE W WO CONTRAST Result Date: 12/19/2023 CLINICAL DATA:  Bone mass or bone pain, pathologic fracture on same-day CT thoracic spine EXAM: MRI CERVICAL, THORACIC AND LUMBAR SPINE WITHOUT AND WITH CONTRAST TECHNIQUE: Multiplanar and multiecho pulse sequences of the cervical spine, to include the craniocervical junction and cervicothoracic junction, and thoracic and lumbar spine, were obtained without and with intravenous contrast. CONTRAST:  7mL GADAVIST  GADOBUTROL  1 MMOL/ML IV SOLN COMPARISON:  08/09/2011 MRI lumbar spine, no prior MRI of the cervical or thoracic spine; correlation is made with CT thoracic and lumbar spine 12/19/2023; no prior CT of the cervical spine available. FINDINGS: Evaluation is somewhat limited by motion, particularly on the postcontrast images. In addition, the patient could not complete the postcontrast imaging. No post-contrast axial could be obtained on the  cervical and lumbar spine. MRI CERVICAL SPINE FINDINGS Alignment: No significant listhesis. Vertebrae: No acute fracture, evidence of discitis, or suspicious osseous lesion. Cord: Normal in signal and morphology. Posterior Fossa, vertebral arteries, paraspinal tissues: Low lying cerebellar tonsils, which extend approximately 4 mm below the foramen magnum and do not meet criteria for Chiari 1 malformation. Normal vertebral artery flow voids. Disc levels: C2-C3: No significant disc bulge. No spinal canal stenosis or neuroforaminal narrowing. C3-C4: No significant disc bulge. Uncovertebral hypertrophy. No spinal canal stenosis. Mild right neural foraminal narrowing. C4-C5: Disc height loss and minimal disc bulge. Facet and uncovertebral hypertrophy. No spinal canal stenosis. Severe left and mild right neural foraminal narrowing. C5-C6: Disc height loss. Facet and uncovertebral hypertrophy. No spinal canal stenosis. Severe left and moderate right neural foraminal narrowing. C6-C7: Left paracentral disc protrusion. Facet arthropathy. No spinal canal stenosis or neural foraminal narrowing. C7-T1: No significant disc bulge. Facet arthropathy. No spinal canal stenosis. Mild right neural foraminal narrowing. MRI THORACIC SPINE FINDINGS Alignment: Focal kyphosis centered on the T8, T9, and T10 vertebral body fractures. Otherwise normal alignment. Vertebrae: Redemonstrated destruction of the majority of  the T9 vertebral body, for which only a portion of the posterior cortex is seen. The posterior cortex extends approximately 8 mm into the spinal canal. Significant loss of the T8 vertebral body, with only 60% vertebral body height at the anterior aspect and 50% of the vertebral body present at the posterior aspect. At the superior aspect of T10, there is approximately 20% vertebral body height loss. Decreased T1 and increased T2 signal is seen throughout the T8, T9, and T10 vertebral bodies, with abnormal signal extending into  posterior elements (series 19, image 5 and 17). Although postcontrast imaging is of limited utility, there appears to be diffuse enhancement in this area (series 24, image 10). Similar abnormal signal in the left third rib (series 10, image 20), which also appears to enhance (series 24, image 28). Cord: Mild cord deformation at T9, without abnormal spinal cord signal. No abnormal enhancement. Paraspinal and other soft tissues: Masslike enhancement in the medial right lung, possibly at the right hilum (series 23, image 28). Disc levels: Moderate spinal canal stenosis posterior to T8-T9 through T9-T10, secondary to retropulsion (series 20, image 30). Severe bilateral neural foraminal narrowing at T8-T9 and T9-T10. MRI LUMBAR SPINE FINDINGS Segmentation:  5 lumbar type vertebral bodies. Alignment: Dextroscoliosis. Trace retrolisthesis of L1 on L2. Trace anterolisthesis of L4 on L5. Vertebrae: No acute fracture, evidence of discitis, or suspicious osseous lesion. Endplate degenerative changes, most prominent at L3-L4, eccentric to the left and at L5-S1, eccentric to the right. No abnormal enhancement. Congenitally short pedicles, which narrow the AP diameter of the spinal canal. Conus medullaris and cauda equina: Conus extends to the L2 level. Conus and cauda equina appear normal. Paraspinal and other soft tissues:  No acute finding. Disc levels: T12-L1: No significant disc bulge. No spinal canal stenosis or neural foraminal narrowing. L1-L2: No significant disc bulge. Mild facet arthropathy. Trace retrolisthesis. No spinal canal stenosis. Mild left neural foraminal narrowing. L2-L3: Minimal disc bulge. Mild facet arthropathy. No spinal canal stenosis or neural foraminal narrowing. L3-L4: Mild disc bulge. Mild-to-moderate facet arthropathy. Narrowing of the lateral recesses. Mild spinal canal stenosis. Moderate left and mild right neural foraminal narrowing. L4-L5: Trace anterolisthesis with disc unroofing and mild disc  bulge. Severe facet arthropathy. Narrowing of the lateral recesses. Mild-to-moderate spinal canal stenosis. Mild bilateral neural foraminal narrowing. L5-S1: Minimal disc bulge with right eccentric disc osteophyte complex, which may contact the exiting right L5 nerve roots. Moderate right and mild left facet arthropathy. No spinal canal stenosis. Moderate right neural foraminal narrowing. IMPRESSION: 1. Evaluation is somewhat limited by motion and the patient could not complete the postcontrast imaging. No post-contrast axial could be obtained on the cervical and lumbar spine. 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 No cord signal abnormality. 2. Abnormal signal and enhancement in the left third rib. Findings are concerning for additional site of metastatic disease. 3. Masslike enhancement in the medial right lung, possibly at the right hilum, concerning for a primary neoplastic process or metastatic disease. Recommend further evaluation with a CT of the chest with contrast. 4. L3-L4 mild spinal canal stenosis with moderate left and mild right neural foraminal narrowing. Narrowing of the lateral recesses at this level could affect the descending L4 nerve roots. 5. L4-L5 mild-to-moderate spinal canal stenosis and mild bilateral neural foraminal narrowing. Narrowing of the lateral recesses at this level could  affect the descending L5 nerve roots. 6. L5-S1 moderate right neural foraminal narrowing. 7. C4-C5 severe left and mild right neural foraminal narrowing. 8. C5-C6 severe left and moderate right neural foraminal narrowing. Electronically Signed   By: Zoila Hines M.D.   On: 12/19/2023 14:24   MR Lumbar Spine W Wo Contrast Result Date: 12/19/2023 CLINICAL DATA:  Bone mass or bone pain, pathologic fracture on same-day CT thoracic spine EXAM:  MRI CERVICAL, THORACIC AND LUMBAR SPINE WITHOUT AND WITH CONTRAST TECHNIQUE: Multiplanar and multiecho pulse sequences of the cervical spine, to include the craniocervical junction and cervicothoracic junction, and thoracic and lumbar spine, were obtained without and with intravenous contrast. CONTRAST:  7mL GADAVIST  GADOBUTROL  1 MMOL/ML IV SOLN COMPARISON:  08/09/2011 MRI lumbar spine, no prior MRI of the cervical or thoracic spine; correlation is made with CT thoracic and lumbar spine 12/19/2023; no prior CT of the cervical spine available. FINDINGS: Evaluation is somewhat limited by motion, particularly on the postcontrast images. In addition, the patient could not complete the postcontrast imaging. No post-contrast axial could be obtained on the cervical and lumbar spine. MRI CERVICAL SPINE FINDINGS Alignment: No significant listhesis. Vertebrae: No acute fracture, evidence of discitis, or suspicious osseous lesion. Cord: Normal in signal and morphology. Posterior Fossa, vertebral arteries, paraspinal tissues: Low lying cerebellar tonsils, which extend approximately 4 mm below the foramen magnum and do not meet criteria for Chiari 1 malformation. Normal vertebral artery flow voids. Disc levels: C2-C3: No significant disc bulge. No spinal canal stenosis or neuroforaminal narrowing. C3-C4: No significant disc bulge. Uncovertebral hypertrophy. No spinal canal stenosis. Mild right neural foraminal narrowing. C4-C5: Disc height loss and minimal disc bulge. Facet and uncovertebral hypertrophy. No spinal canal stenosis. Severe left and mild right neural foraminal narrowing. C5-C6: Disc height loss. Facet and uncovertebral hypertrophy. No spinal canal stenosis. Severe left and moderate right neural foraminal narrowing. C6-C7: Left paracentral disc protrusion. Facet arthropathy. No spinal canal stenosis or neural foraminal narrowing. C7-T1: No significant disc bulge. Facet arthropathy. No spinal canal stenosis. Mild  right neural foraminal narrowing. MRI THORACIC SPINE FINDINGS Alignment: Focal kyphosis centered on the T8, T9, and T10 vertebral body fractures. Otherwise normal alignment. Vertebrae: Redemonstrated destruction of the majority of the T9 vertebral body, for which only a portion of the posterior cortex is seen. The posterior cortex extends approximately 8 mm into the spinal canal. Significant loss of the T8 vertebral body, with only 60% vertebral body height at the anterior aspect and 50% of the vertebral body present at the posterior aspect. At the superior aspect of T10, there is approximately 20% vertebral body height loss. Decreased T1 and increased T2 signal is seen throughout the T8, T9, and T10 vertebral bodies, with abnormal signal extending into posterior elements (series 19, image 5 and 17). Although postcontrast imaging is of limited utility, there appears to be diffuse enhancement in this area (series 24, image 10). Similar abnormal signal in the left third rib (series 10, image 20), which also appears to enhance (series 24, image 28). Cord: Mild cord deformation at T9, without abnormal spinal cord signal. No abnormal enhancement. Paraspinal and other soft tissues: Masslike enhancement in the medial right lung, possibly at the right hilum (series 23, image 28). Disc levels: Moderate spinal canal stenosis posterior to T8-T9 through T9-T10, secondary to retropulsion (series 20, image 30). Severe bilateral neural foraminal narrowing at T8-T9 and T9-T10. MRI LUMBAR SPINE FINDINGS Segmentation:  5 lumbar type vertebral bodies. Alignment: Dextroscoliosis. Trace retrolisthesis of L1 on  L2. Trace anterolisthesis of L4 on L5. Vertebrae: No acute fracture, evidence of discitis, or suspicious osseous lesion. Endplate degenerative changes, most prominent at L3-L4, eccentric to the left and at L5-S1, eccentric to the right. No abnormal enhancement. Congenitally short pedicles, which narrow the AP diameter of the  spinal canal. Conus medullaris and cauda equina: Conus extends to the L2 level. Conus and cauda equina appear normal. Paraspinal and other soft tissues:  No acute finding. Disc levels: T12-L1: No significant disc bulge. No spinal canal stenosis or neural foraminal narrowing. L1-L2: No significant disc bulge. Mild facet arthropathy. Trace retrolisthesis. No spinal canal stenosis. Mild left neural foraminal narrowing. L2-L3: Minimal disc bulge. Mild facet arthropathy. No spinal canal stenosis or neural foraminal narrowing. L3-L4: Mild disc bulge. Mild-to-moderate facet arthropathy. Narrowing of the lateral recesses. Mild spinal canal stenosis. Moderate left and mild right neural foraminal narrowing. L4-L5: Trace anterolisthesis with disc unroofing and mild disc bulge. Severe facet arthropathy. Narrowing of the lateral recesses. Mild-to-moderate spinal canal stenosis. Mild bilateral neural foraminal narrowing. L5-S1: Minimal disc bulge with right eccentric disc osteophyte complex, which may contact the exiting right L5 nerve roots. Moderate right and mild left facet arthropathy. No spinal canal stenosis. Moderate right neural foraminal narrowing. IMPRESSION: 1. Evaluation is somewhat limited by motion and the patient could not complete the postcontrast imaging. No post-contrast axial could be obtained on the cervical and lumbar spine. 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 No cord signal abnormality. 2. Abnormal signal and enhancement in the left third rib. Findings are concerning for additional site of metastatic disease. 3. Masslike enhancement in the medial right lung, possibly at the right hilum, concerning for a primary neoplastic process or metastatic disease. Recommend further evaluation with a CT of the chest with contrast. 4. L3-L4 mild  spinal canal stenosis with moderate left and mild right neural foraminal narrowing. Narrowing of the lateral recesses at this level could affect the descending L4 nerve roots. 5. L4-L5 mild-to-moderate spinal canal stenosis and mild bilateral neural foraminal narrowing. Narrowing of the lateral recesses at this level could affect the descending L5 nerve roots. 6. L5-S1 moderate right neural foraminal narrowing. 7. C4-C5 severe left and mild right neural foraminal narrowing. 8. C5-C6 severe left and moderate right neural foraminal narrowing. Electronically Signed   By: Zoila Hines M.D.   On: 12/19/2023 14:24   CT Lumbar Spine Wo Contrast Result Date: 12/19/2023 CLINICAL DATA:  Low back pain. Cancer suspected. Pain over the last 2 months. EXAM: CT LUMBAR SPINE WITHOUT CONTRAST TECHNIQUE: Multidetector CT imaging of the lumbar spine was performed without intravenous contrast administration. Multiplanar CT image reconstructions were also generated. RADIATION DOSE REDUCTION: This exam was performed according to the departmental dose-optimization program which includes automated exposure control, adjustment of the mA and/or kV according to patient size and/or use of iterative reconstruction technique. COMPARISON:  Thoracic CT same day FINDINGS: Segmentation: 5 lumbar type vertebral bodies. Alignment: Early Od a curvature convex to the right with the apex at L3. Degenerative anterolisthesis at L4-5 of 4 mm. Vertebrae: Degenerative endplate changes on the left at L3-4 and on the right at L4-5 and L5-S1 with sclerotic and early cystic changes. Cystic arthropathy present affecting the facet joints at L2-3, L3-4, L4-5 and L5-S1 as below. Paraspinal and other soft tissues: Negative. No evidence of adenopathy or mass lesion. Disc  levels: T12-L1: Normal interspace. L1-2: Mild bulging of the disc more towards the left. No compressive stenosis. L2-3: Disc degeneration more pronounced on the left. Mild bulging of the disc. No  compressive stenosis. Mild facet arthropathy on the left. L3-4: Disc degeneration more pronounced on the left with disc space narrowing, sclerotic endplate changes and underlying cystic endplate changes. Circumferential bulging of the disc. Bilateral facet arthropathy with cystic changes. Moderate multifactorial spinal stenosis at this level that could be symptomatic, particularly on the left. L4-5: Facet arthropathy with cystic changes, worse on the right. Anterolisthesis of 4 mm. Disc degeneration more pronounced on the right with sclerotic and cystic endplate changes. Stenosis of the canal and lateral recesses that could cause neural compression, more likely on the right. L5-S1: Disc degeneration more pronounced on the right with vacuum phenomenon, endplate sclerotic changes and cystic changes. Facet arthropathy on the right with cystic changes. No central canal stenosis. Right foraminal stenosis that could affect the exiting right L5 nerve. IMPRESSION: 1. No evidence of malignancy. 2. Scoliosis convex to the right. 3. L3-4: Disc degeneration more pronounced on the left with disc space narrowing, sclerotic endplate changes and underlying cystic endplate changes. Circumferential bulging of the disc. Bilateral facet arthropathy with cystic changes. Moderate multifactorial spinal stenosis at this level that could be symptomatic, particularly on the left. 4. L4-5: Facet arthropathy with cystic changes, worse on the right. Anterolisthesis of 4 mm. Disc degeneration more pronounced on the right with sclerotic and cystic endplate changes. Stenosis of the canal and lateral recesses that could cause neural compression, more likely on the right. 5. L5-S1: Disc degeneration more pronounced on the right with vacuum phenomenon, endplate sclerotic changes and cystic changes. Facet arthropathy on the right with cystic changes. Right foraminal stenosis that could affect the exiting right L5 nerve. 6. The appearance of the  endplate degenerative changes and the facet arthropathy is unusual for a person of this age, with prominent cystic changes. Findings could be seen with a systemic arthropathy such as CPPD or gout. The findings in this region are not specifically suggestive of tuberculosis of the spine as was suggested in the thoracic region. Electronically Signed   By: Bettylou Brunner M.D.   On: 12/19/2023 11:37   CT Thoracic Spine Wo Contrast Result Date: 12/19/2023 CLINICAL DATA:  Bone lesion, thoracic spine, incidental. Back pain over the last 2 months. EXAM: CT THORACIC SPINE WITHOUT CONTRAST TECHNIQUE: Multidetector CT images of the thoracic were obtained using the standard protocol without intravenous contrast. RADIATION DOSE REDUCTION: This exam was performed according to the departmental dose-optimization program which includes automated exposure control, adjustment of the mA and/or kV according to patient size and/or use of iterative reconstruction technique. COMPARISON:  Lumbar MRI 08/09/2011 FINDINGS: Alignment: Kyphosis of the thoracic spine with the apex at T9-10, 55 degrees by measurement. Vertebrae: Lytic destruction of the T9 vertebral body. Posterior bowing of the posterior margin of the vertebral body. Lytic destruction of the inferior aspect of the T8 vertebral body in the superior aspect of the T10 vertebral body. There is an acute fracture of the right posterior eighth rib. There is a fracture of the transverse process on the right at T8. Paraspinal and other soft tissues: Some inflammatory changes of the posterior mediastinal soft tissues in the region from T8-T10. I do not identify definite soft tissue canal compromise, but MRI of the thoracic spine is recommended with and without contrast. Patient has obstruction/occlusion of the right middle lobe and lower lobe bronchi  with volume loss of the lung in those regions. Disc levels: No significant abnormality seen from T1 through T7 and from the T10-11 disc level  to the lumbar region. IMPRESSION: 1. Lytic destruction of the T9 vertebral body with posterior bowing of the posterior margin of the vertebral body. Lytic destruction of the inferior aspect of the T8 vertebral body and the superior aspect of the T10 vertebral body. Acute fracture of the right posterior eighth rib. Fracture of the transverse process on the right at T8. Some surrounding soft tissue inflammatory change within the posterior mediastinum but no evidence of low-density abscess formation. Lung disease affecting the right middle and lower lobes. 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 This is a rare diagnosis and the differential diagnosis would also include metastatic disease and myeloma/plasmacytoma. Pyogenic spinal infection is less likely given the absence of an elevated white count and the absence of more pronounced surrounding soft tissue changes. Electronically Signed   By: Bettylou Brunner M.D.   On: 12/19/2023 11:24        Scheduled Meds:  acetaminophen   1,000 mg Oral QID   ketorolac   30 mg Intravenous Q6H   lidocaine   1 patch Transdermal Q24H   methadone   90 mg Oral Daily   polyethylene glycol  17 g Oral Daily   sodium chloride  flush  3 mL Intravenous Q12H   Continuous Infusions:   LOS: 0 days    Time spent: 35 minutes    Allyn Bartelson Mason Sole, MD [facility] Pager [phone]  If 7PM-7AM, please contact night-coverage [email or web address]  12/20/2023, 4:53 PM

## 2023-12-20 NOTE — Progress Notes (Addendum)
 Patient very anxious about his methadone  dose. Patient called nurse at [facility] [place]. He requested that I speak with her. Nurse verified that patient last dosed yesterday and took Methadone  90mg . I confirmed that we had no questions for her and that the information patient had shared had already been relayed to the provider we just needed an order. She advised that if the provider had any questions they could contact the clinic.

## 2023-12-21 ENCOUNTER — Ambulatory Visit: Payer: MEDICAID | Admitting: Orthopedic Surgery

## 2023-12-21 DIAGNOSIS — M4624 Osteomyelitis of vertebra, thoracic region: Secondary | ICD-10-CM | POA: Diagnosis not present

## 2023-12-21 DIAGNOSIS — M899 Disorder of bone, unspecified: Secondary | ICD-10-CM | POA: Diagnosis not present

## 2023-12-21 DIAGNOSIS — F1190 Opioid use, unspecified, uncomplicated: Secondary | ICD-10-CM | POA: Diagnosis not present

## 2023-12-21 DIAGNOSIS — M4850XA Collapsed vertebra, not elsewhere classified, site unspecified, initial encounter for fracture: Secondary | ICD-10-CM

## 2023-12-21 DIAGNOSIS — R918 Other nonspecific abnormal finding of lung field: Secondary | ICD-10-CM | POA: Diagnosis not present

## 2023-12-21 DIAGNOSIS — M532X4 Spinal instabilities, thoracic region: Secondary | ICD-10-CM | POA: Diagnosis not present

## 2023-12-21 LAB — BASIC METABOLIC PANEL
Anion gap: 9 (ref 5–15)
BUN: 16 mg/dL (ref 6–20)
CO2: 23 mmol/L (ref 22–32)
Calcium: 8.8 mg/dL — ABNORMAL LOW (ref 8.9–10.3)
Chloride: 101 mmol/L (ref 98–111)
Creatinine, Ser: 0.67 mg/dL (ref 0.61–1.24)
GFR, Estimated: >60 mL/min (ref >60)
Glucose, Bld: 146 mg/dL — ABNORMAL HIGH (ref 70–99)
Potassium: 3.6 mmol/L (ref 3.5–5.1)
Sodium: 133 mmol/L — ABNORMAL LOW (ref 135–145)

## 2023-12-21 LAB — CBC
HCT: 31.3 % — ABNORMAL LOW (ref 39.0–52.0)
Hemoglobin: 10.5 g/dL — ABNORMAL LOW (ref 13.0–17.0)
MCH: 27.6 pg (ref 26.0–34.0)
MCHC: 33.5 g/dL (ref 30.0–36.0)
MCV: 82.4 fL (ref 80.0–100.0)
Platelets: 335 10*3/uL (ref 150–400)
RBC: 3.8 MIL/uL — ABNORMAL LOW (ref 4.22–5.81)
RDW: 14.8 % (ref 11.5–15.5)
WBC: 8.9 10*3/uL (ref 4.0–10.5)
nRBC: 0 % (ref 0.0–0.2)

## 2023-12-21 LAB — KAPPA/LAMBDA LIGHT CHAINS
Kappa free light chain: 55.4 mg/L — ABNORMAL HIGH (ref 3.3–19.4)
Kappa, lambda light chain ratio: 1.58 (ref 0.26–1.65)
Lambda free light chains: 35.1 mg/L — ABNORMAL HIGH (ref 5.7–26.3)

## 2023-12-21 MED ORDER — OXYCODONE HCL 5 MG PO TABS
10.0000 mg | ORAL_TABLET | ORAL | Status: DC | PRN
  Administered 2023-12-21 – 2023-12-23 (×9): 10 mg via ORAL
  Filled 2023-12-21 (×9): qty 2

## 2023-12-21 NOTE — Plan of Care (Signed)

## 2023-12-21 NOTE — Progress Notes (Addendum)
1      PROGRESS NOTE    NILMAR HAGERMAN  UJW:[id number] DOB: 1976/02/03 DOA: 12/19/2023 PCP: Patient, No Pcp Per    Brief Narrative:   48 year old male admitted for intractable back pain  1/15: CT aspiration/biopsy thoracic T9-10 interspace, neurosurgery consult 1/16: Waiting for transfer to [facility] -accepted by neurosurgery Dr. Manley Mason.  No beds available.   Assessment & Plan:   Principal Problem:   Lytic bone lesions (T8 -10) Active Problems:   Pulmonary mass   Opioid use disorder   Pathologic thoracic fracture   Spinal stenosis, thoracic   Myelomalacia (HCC)   Spinal instabilities of thoracic region   Lytic lesion of bone on x-ray  Intractable back pain - Discitis/Osteomyelitis/Mets? 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 Smith Robert will see him on Friday TLSO brace for stabilization for now 1/4 blood culture bottle growing gram-positive cocci.  BC ID detecting staph species.  Holding antibiotics for now Waiting for transfer to [facility] -accepted by neurosurgery Dr. Manley Mason.  No beds available.  He is placed on waitlist  Pulmonary mass Initially seen on imaging but CT chest/abdomen/pelvis not confirming same. Pending oncology consult.  Dr. Smith Robert is planning to see him tomorrow  Opioid use disorder Patient reports active intravenous fentanyl use.  He shared with me that he did IV heroin about a week ago   - HCV antibody positive.   Will discuss with ID - Prior to discharge, will benefit from [facility] provided resources - Continue methadone -His pain control has been challenging despite good dose of methadone, oxycodone, fentanyl and Toradol.  I will consult palliative care to see if they can help with pain management while in the hospital  DVT prophylaxis:  SCDs Start: 12/19/23 1423     Code Status: Full code Family Communication: No family at bedside "discussed with patient") Disposition Plan: Waiting for transfer to [facility]   Consultants:  Neurosurgery, IR  Procedures:  CT aspiration/biopsy thoracic T9-10 interspace    Subjective:  Requesting to adjust his pain medications.  Waiting for transfer to [facility].  Sitting in the chair  Objective: Vitals:   12/21/23 0203 12/21/23 0403 12/21/23 0505 12/21/23 0821  BP:    133/77  Pulse:    87  Resp: 16 16 16 16   Temp:    98.3 F (36.8 C)  TempSrc:    Oral  SpO2:    100%  Weight:      Height:        Intake/Output Summary (Last 24 hours) at 12/21/2023 1515 Last data filed at 12/21/2023 1037 Gross per 24 hour  Intake 720 ml  Output --  Net 720 ml   Filed Weights   12/19/23 0548  Weight: 77.1 kg    Examination:  General exam: Appears calm and comfortable  Respiratory system: Clear to auscultation. Respiratory effort normal. Cardiovascular system: S1 & S2 heard, RRR. No JVD, murmurs, rubs, gallops or clicks. No pedal edema. Gastrointestinal system: Abdomen is soft, benign Central nervous system:  Alert and oriented. No focal neurological deficits. Extremities: Symmetric 5 x 5 power.  Has kyphotic deformity in the thoracic spine Skin: No rashes, lesions or ulcers Psychiatry: Judgement and insight appear normal. Mood & affect appropriate.     Data Reviewed: I have personally reviewed following labs and imaging studies  CBC: Recent Labs  Lab 12/18/23 0926 12/19/23 1220 12/20/23 0625 12/21/23 0509  WBC 9.3 9.3 9.6 8.9  NEUTROABS  --  6.5 7.8*  --    HGB 10.4* 10.1* 11.3* 10.5*  HCT 32.5* 31.6* 34.1* 31.3*  MCV 86.7 86.1 82.6 82.4  PLT 289 292 339 335   Basic Metabolic Panel: Recent Labs  Lab 12/18/23 0926 12/19/23 1220 12/20/23 0625 12/21/23 0509  NA 136 136 132* 133*  K 3.8 3.9 3.8 3.6  CL 102 99 97* 101  CO2 23 23 23 23   GLUCOSE 114* 75 97 146*  BUN 29* 14 11 16   CREATININE 0.84 0.71 0.63 0.67  CALCIUM 8.7* 8.8* 8.8* 8.8*   GFR: Estimated Creatinine Clearance: 121.6 mL/min (by C-G formula based on SCr of 0.67 mg/dL). Liver Function Tests: Recent Labs  Lab 12/18/23 0926 12/19/23 1220  AST 23 21  ALT 19 16  ALKPHOS 62 65  BILITOT 0.7 0.7  PROT 7.4 7.1  ALBUMIN 3.3* 3.2*   Recent Labs  Lab 12/18/23 0926  LIPASE 22    Sepsis Labs: Recent Labs  Lab 12/19/23 1220  LATICACIDVEN 0.7    Recent Results (from the past 240 hours)  Blood culture (routine x 2)     Status: None (Preliminary result)   Collection Time: 12/19/23 12:20 PM   Specimen: BLOOD  Result Value Ref Range Status   Specimen Description BLOOD BLOOD LEFT FOREARM  Final   Special Requests   Final    BOTTLES DRAWN AEROBIC AND ANAEROBIC Blood Culture results may not be optimal due to an inadequate volume of blood received in culture bottles   Culture   Final    NO GROWTH 2 DAYS Performed at [facility], [address], [place], [place] [address]    Report Status PENDING  Incomplete  Blood culture (routine x 2)     Status: None (Preliminary result)   Collection Time: 12/19/23 12:30 PM   Specimen: BLOOD RIGHT HAND  Result Value Ref Range Status   Specimen Description   Final    BLOOD RIGHT HAND Performed at [facility], [address], [place], [place] [address]    Special Requests   Final    BOTTLES DRAWN AEROBIC AND ANAEROBIC Blood Culture results may not be optimal due to an inadequate volume of blood received in culture bottles Performed at [facility], [address] [place], [place] [address]     Culture  Setup Time   Final    GRAM POSITIVE COCCI ANAEROBIC BOTTLE ONLY CRITICAL RESULT CALLED TO, READ BACK BY AND VERIFIED WITH: Terie Purser [facility] [id number] 12/20/23 HNM Performed at [facility], [address] [place], [place] [address]    Culture GRAM POSITIVE COCCI  Final   Report Status PENDING  Incomplete  Blood Culture ID Panel (Reflexed)     Status: Abnormal   Collection Time: 12/19/23 12:30 PM  Result Value Ref Range Status   Enterococcus faecalis NOT DETECTED NOT DETECTED Final   Enterococcus Faecium NOT DETECTED NOT DETECTED Final   Listeria monocytogenes NOT DETECTED NOT DETECTED Final   Staphylococcus species DETECTED (A) NOT DETECTED Final  Comment: CRITICAL RESULT CALLED TO, READ BACK BY AND VERIFIED WITH: WILL Dareen Piano PHARMD 1158 12/20/23 HNM    Staphylococcus aureus (BCID) NOT DETECTED NOT DETECTED Final   Staphylococcus epidermidis NOT DETECTED NOT DETECTED Final   Staphylococcus lugdunensis NOT DETECTED NOT DETECTED Final   Streptococcus species NOT DETECTED NOT DETECTED Final   Streptococcus agalactiae NOT DETECTED NOT DETECTED Final   Streptococcus pneumoniae NOT DETECTED NOT DETECTED Final   Streptococcus pyogenes NOT DETECTED NOT DETECTED Final   A.calcoaceticus-baumannii NOT DETECTED NOT DETECTED Final   Bacteroides fragilis NOT DETECTED NOT DETECTED Final   Enterobacterales NOT DETECTED NOT DETECTED Final   Enterobacter cloacae complex NOT DETECTED NOT DETECTED Final   Escherichia coli NOT DETECTED NOT DETECTED Final   Klebsiella aerogenes NOT DETECTED NOT DETECTED Final   Klebsiella oxytoca NOT DETECTED NOT DETECTED Final   Klebsiella pneumoniae NOT DETECTED NOT DETECTED Final   Proteus species NOT DETECTED NOT DETECTED Final   Salmonella species NOT DETECTED NOT DETECTED Final   Serratia marcescens NOT DETECTED NOT DETECTED Final   Haemophilus influenzae NOT DETECTED NOT DETECTED Final   Neisseria meningitidis NOT DETECTED NOT DETECTED Final    Pseudomonas aeruginosa NOT DETECTED NOT DETECTED Final   Stenotrophomonas maltophilia NOT DETECTED NOT DETECTED Final   Candida albicans NOT DETECTED NOT DETECTED Final   Candida auris NOT DETECTED NOT DETECTED Final   Candida glabrata NOT DETECTED NOT DETECTED Final   Candida krusei NOT DETECTED NOT DETECTED Final   Candida parapsilosis NOT DETECTED NOT DETECTED Final   Candida tropicalis NOT DETECTED NOT DETECTED Final   Cryptococcus neoformans/gattii NOT DETECTED NOT DETECTED Final    Comment: Performed at [facility], [address] [place], [place] [address]  Aerobic/Anaerobic Culture w Gram Stain (surgical/deep wound)     Status: None (Preliminary result)   Collection Time: 12/20/23  3:57 PM   Specimen: Wound; Abscess  Result Value Ref Range Status   Specimen Description   Final    WOUND Performed at [facility], [address] [place], [place] [address]    Special Requests   Final    NONE Performed at [facility], [address] [place], [place] [address]    Gram Stain   Final    MODERATE WBC PRESENT, PREDOMINANTLY PMN NO ORGANISMS SEEN    Culture   Final    NO GROWTH < 24 HOURS Performed at [facility], [address], [place], [place] [address]    Report Status PENDING  Incomplete         Radiology Studies: CT BONE TROCAR/NEEDLE BIOPSY DEEP Result Date: 12/20/2023 CLINICAL DATA:  Collapse of T9 with acute kyphosis T9-10, bony destruction of T9 with posterior displacement of fracture fragments EXAM: CT GUIDED DEEP  BONE ASPIRATION AND CORE BIOPSY TECHNIQUE: Patient was placed prone on the CT gantry and limited axial scans through the thorax were obtained. This exam was performed according to the departmental dose-optimization program which includes automated exposure control, adjustment of the mA and/or kV according to patient size and/or use of iterative reconstruction technique. Appropriate skin entry site was identified.  Skin site was marked, prepped with chlorhexidine, draped in usual sterile fashion, and infiltrated locally with 1% lidocaine. Intravenous Fentanyl and Versed 1.5mg  were administered by RN during a total moderate (conscious) sedation time of 26 minutes; the patient's level of consciousness and physiological / cardiorespiratory status were monitored continuously by radiology RN under my direct supervision. Under angled CT fluoroscopic guidance,  initially a 22 gauge spinal needle was advanced into the T9-10 interspace from a right posterolateral approach in the region of bony destruction. A scant amount of bloody fluid was aspirated, sent for Gram stain and culture. Using the same approach, an 11-gauge Cook trocar bone needle was advanced into the T9-10 interspace posterior margin. Coaxial was obtained, also sent for Gram stain and culture. Subsequently, bone biopsy using the outer 11 gauge trocar needle was obtained, submitted to surgical pathology. Postprocedure scans show no hemorrhage or other apparent complication. No pneumothorax. Patient tolerated procedure well. COMPLICATIONS: COMPLICATIONS none IMPRESSION: 1. Technically successful CT guided thoracic spine T9-10 core and aspiration bone biopsy, sent for surgical pathology, Gram stain and culture. Electronically Signed   By: Corlis Leak M.D.   On: 12/20/2023 16:21   CT CHEST ABDOMEN PELVIS W CONTRAST Result Date: 12/19/2023 CLINICAL DATA:  Back pain.  Evaluation of metastatic disease. EXAM: CT CHEST, ABDOMEN, AND PELVIS WITH CONTRAST TECHNIQUE: Multidetector CT imaging of the chest, abdomen and pelvis was performed following the standard protocol during bolus administration of intravenous contrast. RADIATION DOSE REDUCTION: This exam was performed according to the departmental dose-optimization program which includes automated exposure control, adjustment of the mA and/or kV according to patient size and/or use of iterative reconstruction technique.  CONTRAST:  OMNIPAQUE IOHEXOL 300 MG/ML  SOLN COMPARISON:  Thoracic and lumbar spine CT and MRI dated 12/19/2023. FINDINGS: CT CHEST FINDINGS Cardiovascular: There is no cardiomegaly or pericardial effusion. The thoracic aorta is unremarkable. The origins of the great vessels of the aortic arch and the central pulmonary arteries appear patent. Mediastinum/Nodes: No hilar or mediastinal adenopathy. The esophagus is grossly unremarkable. No mediastinal fluid collection. Lungs/Pleura: There is right infrahilar peribronchial thickening and airspace density in the right lower lobe concerning for bronchitis and pneumonia. An underlying mass is less likely but not excluded. Clinical correlation and follow-up to resolution recommended. There is a 6 mm subpleural nodule or para fissural lymph node along the left fissure. Additional nodules along the right major fissure measure up to 4 mm. No pleural effusion or pneumothorax. The central airways remain patent. Musculoskeletal: Degenerative changes of the shoulders. Minimally displaced fracture of the posterior right eighth rib. Nondisplaced fracture of the right T8 spinous process. Destructive changes and complete collapse of T9 with posterior buckling. See report of the CT and MRI of the spine for detailed evaluation. CT ABDOMEN PELVIS FINDINGS No intra-abdominal free air or free fluid. Hepatobiliary: Subcentimeter hepatic hypodense lesions are too small to characterize. No biliary dilatation. The gallbladder is unremarkable. Pancreas: The pancreas is grossly unremarkable as visualized. Spleen: Normal in size without focal abnormality. Adrenals/Urinary Tract: The adrenal glands are unremarkable. The kidneys, visualized ureters, and urinary bladder appear unremarkable. Stomach/Bowel: Evaluation of the bowel is limited in the absence of oral contrast and due to paucity of intra-abdominal fat. There is moderate amount of stool throughout the colon. There is no bowel  obstruction or active inflammation. The appendix is not visualized with certainty. No inflammatory changes identified in the right lower quadrant. Vascular/Lymphatic: Mild atherosclerotic calcification of the abdominal aorta. The IVC is unremarkable. No portal venous gas. There is no adenopathy. Reproductive: The prostate and seminal vesicles are grossly unremarkable. No pelvic mass. Other: There is loss of subcutaneous fat and cachexia. Musculoskeletal: Degenerative changes of the spine and multilevel disc desiccation and vacuum phenomena. No acute osseous pathology. IMPRESSION: 1. Right infrahilar peribronchial thickening and airspace density in the right lower lobe concerning for bronchitis and pneumonia.  An underlying mass is less likely but not excluded. Clinical correlation and follow-up to resolution recommended. 2. Minimally displaced fracture of the posterior right eighth rib. Nondisplaced fracture of the right T8 spinous process. 3. Destructive changes and complete collapse of T9 with posterior buckling. See reports for the CT and MRI of the spine. 4. No acute intra-abdominal or pelvic pathology. 5. Moderate colonic stool burden. No bowel obstruction. 6.  Aortic Atherosclerosis (ICD10-I70.0). Electronically Signed   By: Elgie Collard M.D.   On: 12/19/2023 17:05        Scheduled Meds:  acetaminophen  1,000 mg Oral QID   ketorolac  30 mg Intravenous Q6H   lidocaine  1 patch Transdermal Q24H   methadone  90 mg Oral Daily   polyethylene glycol  17 g Oral Daily   sodium chloride flush  3 mL Intravenous Q12H   Continuous Infusions:   LOS: 1 day    Time spent: 35 minutes    Anthony Roland Sherryll Burger, MD [facility] Pager [phone]  If 7PM-7AM, please contact night-coverage [email or web address]  12/21/2023, 3:15 PM

## 2023-12-21 NOTE — Consult Note (Signed)
NAME: Ruben Turner  DOB: 18-Feb-1976  MRN: [id number]  Date/Time: 12/21/2023 4:40 PM  REQUESTING PROVIDER: Dr.Shah Subjective:  REASON FOR CONSULT: vertebral osteomyelitis ? Ruben Turner is a 48 y.o.male  presents with midback pain for 2 months duration with worsenig in the 24 hours before presentation He had no fever, chills or sweats, had an episode of rigor many months ago Pt is IVDA and was in remission but started agin due to back pain  12/18/23  BP 140/76 !  Temp 98.3 F (36.8 C)  Pulse Rate 91  Resp 16  SpO2 100 %  Weight 169 lb 15.6 oz  Height 5\' 11"  (1.803 m)     Latest Reference Range & Units 12/18/23  WBC 4.0 - 10.5 K/uL 9.3  Hemoglobin 13.0 - 17.0 g/dL 47.8 (L)  HCT 29.5 - 62.1 % 32.5 (L)  Platelets 150 - 400 K/uL 289  Creatinine 0.61 - 1.24 mg/dL 3.08    BC sent CT thoracic spine and later MRI showed T8, T9 and T10 vertebral  destructive lesion. 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 HE takes care of his mom who is a patient of mine , and she has MRSA multiple wounds- He does the dressing for her    Past Surgical History:  Procedure Laterality Date   KNEE SURGERY Left    SHOULDER ARTHROSCOPY Right     Social History   Socioeconomic History   Marital status: Single    Spouse name: Not on file   Number of children: Not on file   Years of education: Not on file   Highest education level: Not on file  Occupational History   Not on file  Tobacco Use   Smoking status: Every Day    Types: Cigarettes   Smokeless tobacco: Never  Vaping Use   Vaping status: Never Used  Substance and Sexual Activity   Alcohol use: No   Drug use: Not on file   Sexual activity: Not on file  Other Topics Concern   Not on file  Social History Narrative   Not on file   Social Drivers of Health    Financial Resource Strain: Not on file  Food Insecurity: No Food Insecurity (12/20/2023)   Hunger Vital Sign    Worried About Running Out of Food in the Last Year: Never true    Ran Out of Food in the Last Year: Never true  Transportation Needs: No Transportation Needs (12/20/2023)   PRAPARE - Administrator, Civil Service (Medical): No    Lack of Transportation (Non-Medical): No  Physical Activity: Not on file  Stress: Not on file  Social Connections: Not on file  Intimate Partner Violence: Not At Risk (12/20/2023)   Humiliation, Afraid, Rape, and Kick questionnaire    Fear of Current or Ex-Partner: No    Emotionally Abused: No    Physically Abused: No    Sexually Abused: No    History reviewed. No pertinent family history. Allergies  Allergen Reactions   Erythromycin    Penicillins     Chest tightness    I? Current Facility-Administered Medications  Medication Dose Route Frequency Provider Last Rate Last Admin   acetaminophen (TYLENOL) tablet 1,000 mg  1,000 mg Oral QID Verdene Lennert, MD   1,000 mg at 12/21/23 1447  fentaNYL (SUBLIMAZE) injection 50 mcg  50 mcg Intravenous Q2H PRN Verdene Lennert, MD   50 mcg at 12/21/23 1447   ketorolac (TORADOL) 30 MG/ML injection 30 mg  30 mg Intravenous Q6H Verdene Lennert, MD   30 mg at 12/21/23 1208   lidocaine (LIDODERM) 5 % 1 patch  1 patch Transdermal Q24H Verdene Lennert, MD   1 patch at 12/20/23 2054   methadone (DOLOPHINE) tablet 90 mg  90 mg Oral Daily Delfino Lovett, MD   90 mg at 12/21/23 0102   nicotine polacrilex (NICORETTE) gum 2 mg  2 mg Oral PRN Verdene Lennert, MD   2 mg at 12/20/23 2211   oxyCODONE (Oxy IR/ROXICODONE) immediate release tablet 10 mg  10 mg Oral Q4H PRN Delfino Lovett, MD       polyethylene glycol (MIRALAX / GLYCOLAX) packet 17 g  17 g Oral Daily Verdene Lennert, MD   17 g at 12/21/23 1212   sodium chloride flush (NS) 0.9 % injection 3 mL  3 mL Intravenous Q12H Verdene Lennert, MD   3 mL at  12/21/23 0906   traZODone (DESYREL) tablet 50 mg  50 mg Oral QHS PRN Verdene Lennert, MD   50 mg at 12/20/23 2055     Abtx:  Anti-infectives (From admission, onward)    None       REVIEW OF SYSTEMS:  Const: negative fever, negative chills,  weight loss over years Eyes: negative diplopia or visual changes, negative eye pain ENT: negative coryza, negative sore throat Resp: negative cough, hemoptysis, dyspnea Cards: negative for chest pain, palpitations, lower extremity edema GU: negative for frequency, dysuria and hematuria GI: Negative for abdominal pain, diarrhea, bleeding, constipation Skin: negative for rash and pruritus Heme: negative for easy bruising and gum/nose bleeding MS: back pain and muscle weakness Neurolo:negative for headaches, dizziness, vertigo, memory problems  Psych: negative for feelings of anxiety, depression  Endocrine: negative for thyroid, diabetes Allergy/Immunology- as above Objective:  VITALS:  BP 115/88   Pulse 80   Temp 98.4 F (36.9 C)   Resp 16   Ht 5\' 11"  (1.803 m)   Wt 77.1 kg   SpO2 100%   BMI 23.71 kg/m   PHYSICAL EXAM:  General: Alert, cooperative, no distress, appears stated age. emaciated Head: Normocephalic, without obvious abnormality, atraumatic. Eyes: Conjunctivae clear, anicteric sclerae. Pupils are equal ENT Nares normal. No drainage or sinus tenderness. Lips, mucosa, and tongue normal. No Thrush Dentition poor Neck: Supple, symmetrical, no adenopathy, thyroid: non tender no carotid bruit and no JVD. Back:Gibbus mid spine Lungs:b/l air entry Heart: s1s2 Abdomen: Soft, non-tender,not distended. Bowel sounds normal. No masses Extremities: atraumatic, no cyanosis. No edema. No clubbing Skin: No rashes or lesions. Or bruising Lymph: Cervical, supraclavicular normal. Neurologic: Grossly non-focal Pertinent Labs Lab Results CBC    Component Value Date/Time   WBC 8.9 12/21/2023 0509   RBC 3.80 (L) 12/21/2023 0509    HGB 10.5 (L) 12/21/2023 0509   HCT 31.3 (L) 12/21/2023 0509   PLT 335 12/21/2023 0509   MCV 82.4 12/21/2023 0509   MCH 27.6 12/21/2023 0509   MCHC 33.5 12/21/2023 0509   RDW 14.8 12/21/2023 0509   LYMPHSABS 1.4 12/20/2023 0625   MONOABS 0.4 12/20/2023 0625   EOSABS 0.0 12/20/2023 0625   BASOSABS 0.0 12/20/2023 0625       Latest Ref Rng & Units 12/21/2023    5:09 AM 12/20/2023    6:25 AM 12/19/2023   12:20 PM  CMP  Glucose 70 -  99 mg/dL 119  97  75   BUN 6 - 20 mg/dL 16  11  14    Creatinine 0.61 - 1.24 mg/dL 1.47  8.29  5.62   Sodium 135 - 145 mmol/L 133  132  136   Potassium 3.5 - 5.1 mmol/L 3.6  3.8  3.9   Chloride 98 - 111 mmol/L 101  97  99   CO2 22 - 32 mmol/L 23  23  23    Calcium 8.9 - 10.3 mg/dL 8.8  8.8  8.8   Total Protein 6.5 - 8.1 g/dL   7.1   Total Bilirubin 0.0 - 1.2 mg/dL   0.7   Alkaline Phos 38 - 126 U/L   65   AST 15 - 41 U/L   21   ALT 0 - 44 U/L   16       Microbiology: Recent Results (from the past 240 hours)  Blood culture (routine x 2)     Status: None (Preliminary result)   Collection Time: 12/19/23 12:20 PM   Specimen: BLOOD  Result Value Ref Range Status   Specimen Description BLOOD BLOOD LEFT FOREARM  Final   Special Requests   Final    BOTTLES DRAWN AEROBIC AND ANAEROBIC Blood Culture results may not be optimal due to an inadequate volume of blood received in culture bottles   Culture   Final    NO GROWTH 2 DAYS Performed at [facility], [address] [place], [place] [address]    Report Status PENDING  Incomplete  Blood culture (routine x 2)     Status: None (Preliminary result)   Collection Time: 12/19/23 12:30 PM   Specimen: BLOOD RIGHT HAND  Result Value Ref Range Status   Specimen Description   Final    BLOOD RIGHT HAND Performed at [facility], [address] [place], [place] [address]    Special Requests   Final    BOTTLES DRAWN AEROBIC AND ANAEROBIC Blood Culture results may not be optimal due to an  inadequate volume of blood received in culture bottles Performed at [facility], [address] [place], [place] [address]    Culture  Setup Time   Final    GRAM POSITIVE COCCI ANAEROBIC BOTTLE ONLY CRITICAL RESULT CALLED TO, READ BACK BY AND VERIFIED WITH: Terie Purser [facility] 2841 12/20/23 HNM Performed at [facility], [address], [place], [place] [address]    Culture GRAM POSITIVE COCCI  Final   Report Status PENDING  Incomplete  Blood Culture ID Panel (Reflexed)     Status: Abnormal   Collection Time: 12/19/23 12:30 PM  Result Value Ref Range Status   Enterococcus faecalis NOT DETECTED NOT DETECTED Final   Enterococcus Faecium NOT DETECTED NOT DETECTED Final   Listeria monocytogenes NOT DETECTED NOT DETECTED Final   Staphylococcus species DETECTED (A) NOT DETECTED Final    Comment: CRITICAL RESULT CALLED TO, READ BACK BY AND VERIFIED WITH: WILL ANDERSON PHARMD 1158 12/20/23 HNM    Staphylococcus aureus (BCID) NOT DETECTED NOT DETECTED Final   Staphylococcus epidermidis NOT DETECTED NOT DETECTED Final   Staphylococcus lugdunensis NOT DETECTED NOT DETECTED Final   Streptococcus species NOT DETECTED NOT DETECTED Final   Streptococcus agalactiae NOT DETECTED NOT DETECTED Final   Streptococcus pneumoniae NOT DETECTED NOT DETECTED Final   Streptococcus pyogenes NOT DETECTED NOT DETECTED Final   A.calcoaceticus-baumannii NOT DETECTED NOT DETECTED Final   Bacteroides fragilis NOT DETECTED NOT DETECTED Final  Enterobacterales NOT DETECTED NOT DETECTED Final   Enterobacter cloacae complex NOT DETECTED NOT DETECTED Final   Escherichia coli NOT DETECTED NOT DETECTED Final   Klebsiella aerogenes NOT DETECTED NOT DETECTED Final   Klebsiella oxytoca NOT DETECTED NOT DETECTED Final   Klebsiella pneumoniae NOT DETECTED NOT DETECTED Final   Proteus species NOT DETECTED NOT DETECTED Final   Salmonella species NOT DETECTED NOT DETECTED Final   Serratia marcescens NOT  DETECTED NOT DETECTED Final   Haemophilus influenzae NOT DETECTED NOT DETECTED Final   Neisseria meningitidis NOT DETECTED NOT DETECTED Final   Pseudomonas aeruginosa NOT DETECTED NOT DETECTED Final   Stenotrophomonas maltophilia NOT DETECTED NOT DETECTED Final   Candida albicans NOT DETECTED NOT DETECTED Final   Candida auris NOT DETECTED NOT DETECTED Final   Candida glabrata NOT DETECTED NOT DETECTED Final   Candida krusei NOT DETECTED NOT DETECTED Final   Candida parapsilosis NOT DETECTED NOT DETECTED Final   Candida tropicalis NOT DETECTED NOT DETECTED Final   Cryptococcus neoformans/gattii NOT DETECTED NOT DETECTED Final    Comment: Performed at [facility], [address] [place], [place] [address]  Aerobic/Anaerobic Culture w Gram Stain (surgical/deep wound)     Status: None (Preliminary result)   Collection Time: 12/20/23  3:57 PM   Specimen: Wound; Abscess  Result Value Ref Range Status   Specimen Description   Final    WOUND Performed at [facility], [address] [place], [place] [address]    Special Requests   Final    NONE Performed at [facility], [address] [place], [place] [address]    Gram Stain   Final    MODERATE WBC PRESENT, PREDOMINANTLY PMN NO ORGANISMS SEEN    Culture   Final    NO GROWTH < 24 HOURS Performed at [facility], [address], [place], [place] [address]    Report Status PENDING  Incomplete    IMAGING RESULTS:  I have personally reviewed the films ?T8-T10 vertebral destruction    Impression/Recommendation 47 yr IVDA presenting with back pian- no fever T8-T9-T10 vertebral body destruction - osteomyelitis a concern Elevated ESR Pt had disc aspiration and sent for culture Pt is being transferred to [facility] for spine stabilizing surgery  Gram positive cocci  staph species in 1 of 4 bottle- likely contaminant  Will repeat blood culture    Will not start any antibiotics currently as patient  is stable- ,  The vertebral body bone is to be sent for pathology and  for AFB, fungal and bacterial cultures If he develops any fever or clinical status changes  , then can start antibiotics ( vanco +cefepime )  HEPC antibody positive IVDA Will check RNA Anemia Hypoalbuminemia?  CT chest shows bronchitis/bronchopneumonia rt infrahilar   ________________________________________________ Discussed with patient, neurosurgeon Note:  This document was prepared using Dragon voice recognition software and may include unintentional dictation errors.

## 2023-12-21 NOTE — Discharge Instructions (Addendum)
Intensive Outpatient Programs   [facility] [address] [place],  [place], [place] [phone]  [facility] (Inpatient and outpatient)[phone] (Suboxone and Methadone) [address] [phone]  [facility] Programs - Intensive Outpatient [address] [place], [place] [address]  [phone]  [facility] (Outpatient, Inpatient, [facility] (Groups and Residental) (insurance only) [phone] [place], [place] [phone]   [facility] (for caregivers and family) [address] Laurell Josephs [address] [place], [place] [phone]  Residential Treatment Programs  [facility](2 years) Residential: 64 days)[facility] [address] [place], [place], [place] [phone] or [phone]  [facility] [address] [place], [place], [place] [phone]  [facility] ([facility] [address] [place], [place], [place] [phone] Admissions: 8am-3pm M-F  [facility] Program: Residential Program [phone] Days)             [facility]: [facility]  [place], [place], [place]  [phone] or [phone] in Hours over the weekend or by referral)  [facility] [address] [place], [place] [address] [phone] (Do virtual or phone assessment, offer transportation within 25 miles, have in patient and Outpatient options)   Mobil Crisis: [facility]:[phone] (for crisis  response 24 hours a day)     Some PCP options in [place] area- not a comprehensive list  [facility]- [phone] [facility]- [phone] [facility]- [phone] [facility]- [phone] [facility]- [phone] Lutricia H[facility]fall- [phone]  or [facility] Health Physician Referral Line [phone]

## 2023-12-21 NOTE — Plan of Care (Signed)
  Problem: Education: Goal: Knowledge of General Education information will improve Description: Including pain rating scale, medication(s)/side effects and non-pharmacologic comfort measures Outcome: Progressing   Problem: Health Behavior/Discharge Planning: Goal: Ability to manage health-related needs will improve Outcome: Progressing   Problem: Clinical Measurements: Goal: Ability to maintain clinical measurements within normal limits will improve Outcome: Progressing Goal: Will remain free from infection Outcome: Progressing Goal: Diagnostic test results will improve Outcome: Progressing Goal: Respiratory complications will improve Outcome: Progressing Goal: Cardiovascular complication will be avoided Outcome: Progressing   Problem: Coping: Goal: Level of anxiety will decrease Outcome: Progressing   Problem: Nutrition: Goal: Adequate nutrition will be maintained Outcome: Progressing   Problem: Pain Managment: Goal: General experience of comfort will improve and/or be controlled Outcome: Progressing   Problem: Safety: Goal: Ability to remain free from injury will improve Outcome: Progressing

## 2023-12-22 ENCOUNTER — Other Ambulatory Visit: Payer: Self-pay

## 2023-12-22 DIAGNOSIS — G9589 Other specified diseases of spinal cord: Secondary | ICD-10-CM

## 2023-12-22 DIAGNOSIS — M4620 Osteomyelitis of vertebra, site unspecified: Secondary | ICD-10-CM

## 2023-12-22 DIAGNOSIS — M8448XA Pathological fracture, other site, initial encounter for fracture: Secondary | ICD-10-CM | POA: Diagnosis not present

## 2023-12-22 DIAGNOSIS — R918 Other nonspecific abnormal finding of lung field: Secondary | ICD-10-CM

## 2023-12-22 DIAGNOSIS — F1190 Opioid use, unspecified, uncomplicated: Secondary | ICD-10-CM | POA: Diagnosis not present

## 2023-12-22 DIAGNOSIS — M4624 Osteomyelitis of vertebra, thoracic region: Secondary | ICD-10-CM | POA: Diagnosis not present

## 2023-12-22 DIAGNOSIS — M532X4 Spinal instabilities, thoracic region: Secondary | ICD-10-CM | POA: Diagnosis not present

## 2023-12-22 DIAGNOSIS — M899 Disorder of bone, unspecified: Secondary | ICD-10-CM | POA: Diagnosis not present

## 2023-12-22 LAB — SURGICAL PATHOLOGY

## 2023-12-22 MED ORDER — ACETAMINOPHEN 325 MG PO TABS
650.0000 mg | ORAL_TABLET | Freq: Four times a day (QID) | ORAL | Status: DC
  Administered 2023-12-22 – 2023-12-23 (×4): 650 mg via ORAL
  Filled 2023-12-22 (×5): qty 2

## 2023-12-22 MED ORDER — SODIUM CHLORIDE 0.9 % IV SOLN
2.0000 g | Freq: Three times a day (TID) | INTRAVENOUS | Status: DC
  Administered 2023-12-22 – 2023-12-23 (×3): 2 g via INTRAVENOUS
  Filled 2023-12-22 (×4): qty 12.5

## 2023-12-22 NOTE — Progress Notes (Signed)
1      PROGRESS NOTE    [name]  YIR:[id number] DOB: 1976-06-11 DOA: 12/19/2023 PCP: Patient, No Pcp Per    Brief Narrative:   48 year old male admitted for intractable back pain  1/15: CT aspiration/biopsy thoracic T9-10 interspace, neurosurgery consult 1/16: Waiting for transfer to [facility] -accepted by neurosurgery Dr. Manley Mason.  No beds available. 1/17: Onco & ID c/s - biopsy c/w abscess - started cefepime   Assessment & Plan:   Principal Problem:   Lytic bone lesions (T8 -10) Active Problems:   Pulmonary mass   Opioid use disorder   Pathologic thoracic fracture   Spinal stenosis, thoracic   Myelomalacia (HCC)   Spinal instabilities of thoracic region   Lytic lesion of bone on x-ray  Intractable back pain - Discitis/Osteomyelitis/Mets? 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 Started cefepime by ID on 1/17 On 1/13 his blood culture with GPC (1/4), BCID detects Staphylococcus species (NOT S aureus) - ID Following Oncology Dr. Smith Robert seen. No malignancy on biopsy.  TLSO brace for stabilization for now Waiting for transfer to [facility] -accepted by neurosurgery Dr. Manley Mason.  No beds available.  He remains on waitlist  Pulmonary mass Initially seen on imaging but CT chest/abdomen/pelvis not confirming same. oncology consult.  Dr. Smith Robert seen. Outpt repeat CT in 6-8 weeks.  Opioid use disorder Patient reports active intravenous fentanyl use.  He shared with me that he did IV heroin about a week ago   - HCV antibody positive. Checking RNA - Continue methadone -His pain control has been challenging despite good dose of methadone, oxycodone,  fentanyl and Toradol. I plan on keeping the current pain regimen, cutting back on tylenol  DVT prophylaxis:  SCDs Start: 12/19/23 1423     Code Status: Full code Family Communication: No family at bedside "discussed with patient" Disposition Plan: Waiting for transfer to [facility]   Consultants:  Neurosurgery, IR  Procedures:  CT aspiration/biopsy thoracic T9-10 interspace    Subjective:  Requesting to adjust/up his pain medications. Still waiting for transfer to [facility].  Sitting in the chair  Objective: Vitals:   12/22/23 0608 12/22/23 0831 12/22/23 1029 12/22/23 1609  BP:  (!) 144/98 116/77 104/83  Pulse:  93  84  Resp: 16 18  20   Temp:  99.5 F (37.5 C) 98.5 F (36.9 C) 97.8 F (36.6 C)  TempSrc:   Oral Oral  SpO2:  100%  96%  Weight:      Height:        Intake/Output Summary (Last 24 hours) at 12/22/2023 1751 Last data filed at 12/22/2023 1429 Gross per 24 hour  Intake 1080 ml  Output --  Net 1080 ml   Filed Weights   12/19/23 0548  Weight: 77.1 kg    Examination:  General exam: Appears calm and comfortable  Respiratory system: Clear to auscultation. Respiratory effort normal. Cardiovascular system: S1 & S2 heard, RRR. No JVD, murmurs, rubs, gallops or clicks. No pedal edema. Gastrointestinal system: Abdomen is soft, benign Central nervous system: Alert and oriented. No focal neurological deficits. Extremities: Symmetric 5 x 5 power.  Has kyphotic deformity in the thoracic spine Skin: No rashes, lesions or  ulcers Psychiatry: Judgement and insight appear normal. Mood & affect appropriate.     Data Reviewed: I have personally reviewed following labs and imaging studies  CBC: Recent Labs  Lab 12/18/23 0926 12/19/23 1220 12/20/23 0625 12/21/23 0509  WBC 9.3 9.3 9.6 8.9  NEUTROABS  --  6.5 7.8*  --   HGB 10.4* 10.1* 11.3* 10.5*  HCT 32.5* 31.6* 34.1* 31.3*  MCV 86.7 86.1 82.6 82.4  PLT 289 292 339 335   Basic Metabolic Panel: Recent Labs  Lab  12/18/23 0926 12/19/23 1220 12/20/23 0625 12/21/23 0509  NA 136 136 132* 133*  K 3.8 3.9 3.8 3.6  CL 102 99 97* 101  CO2 23 23 23 23   GLUCOSE 114* 75 97 146*  BUN 29* 14 11 16   CREATININE 0.84 0.71 0.63 0.67  CALCIUM 8.7* 8.8* 8.8* 8.8*   GFR: Estimated Creatinine Clearance: 121.6 mL/min (by C-G formula based on SCr of 0.67 mg/dL). Liver Function Tests: Recent Labs  Lab 12/18/23 0926 12/19/23 1220  AST 23 21  ALT 19 16  ALKPHOS 62 65  BILITOT 0.7 0.7  PROT 7.4 7.1  ALBUMIN 3.3* 3.2*   Recent Labs  Lab 12/18/23 0926  LIPASE 22    Sepsis Labs: Recent Labs  Lab 12/19/23 1220  LATICACIDVEN 0.7    Recent Results (from the past 240 hours)  Blood culture (routine x 2)     Status: None (Preliminary result)   Collection Time: 12/19/23 12:20 PM   Specimen: BLOOD  Result Value Ref Range Status   Specimen Description BLOOD BLOOD LEFT FOREARM  Final   Special Requests   Final    BOTTLES DRAWN AEROBIC AND ANAEROBIC Blood Culture results may not be optimal due to an inadequate volume of blood received in culture bottles   Culture   Final    NO GROWTH 3 DAYS Performed at [facility], [address], [place], [place] [address]    Report Status PENDING  Incomplete  Blood culture (routine x 2)     Status: None (Preliminary result)   Collection Time: 12/19/23 12:30 PM   Specimen: BLOOD RIGHT HAND  Result Value Ref Range Status   Specimen Description   Final    BLOOD RIGHT HAND Performed at [facility], [address] [place], [place] [address]    Special Requests   Final    BOTTLES DRAWN AEROBIC AND ANAEROBIC Blood Culture results may not be optimal due to an inadequate volume of blood received in culture bottles Performed at [facility], [address] [place], [place] [address]    Culture  Setup Time   Final    GRAM POSITIVE COCCI ANAEROBIC BOTTLE ONLY CRITICAL RESULT CALLED TO, READ BACK BY AND VERIFIED WITH: Terie Purser [facility] 1158  12/20/23 HNM    Culture   Final    GRAM POSITIVE COCCI CULTURE REINCUBATED FOR BETTER GROWTH Performed at [facility], [address], [place], [place] [address]    Report Status PENDING  Incomplete  Blood Culture ID Panel (Reflexed)     Status: Abnormal   Collection Time: 12/19/23 12:30 PM  Result Value Ref Range Status   Enterococcus faecalis NOT DETECTED NOT DETECTED Final   Enterococcus Faecium NOT DETECTED NOT DETECTED Final   Listeria monocytogenes NOT DETECTED NOT DETECTED Final   Staphylococcus species DETECTED (A) NOT DETECTED Final    Comment: CRITICAL RESULT CALLED TO, READ BACK BY AND VERIFIED WITH: Terie Purser [facility] 2956 12/20/23  HNM    Staphylococcus aureus (BCID) NOT DETECTED NOT DETECTED Final   Staphylococcus epidermidis NOT DETECTED NOT DETECTED Final   Staphylococcus lugdunensis NOT DETECTED NOT DETECTED Final   Streptococcus species NOT DETECTED NOT DETECTED Final   Streptococcus agalactiae NOT DETECTED NOT DETECTED Final   Streptococcus pneumoniae NOT DETECTED NOT DETECTED Final   Streptococcus pyogenes NOT DETECTED NOT DETECTED Final   A.calcoaceticus-baumannii NOT DETECTED NOT DETECTED Final   Bacteroides fragilis NOT DETECTED NOT DETECTED Final   Enterobacterales NOT DETECTED NOT DETECTED Final   Enterobacter cloacae complex NOT DETECTED NOT DETECTED Final   Escherichia coli NOT DETECTED NOT DETECTED Final   Klebsiella aerogenes NOT DETECTED NOT DETECTED Final   Klebsiella oxytoca NOT DETECTED NOT DETECTED Final   Klebsiella pneumoniae NOT DETECTED NOT DETECTED Final   Proteus species NOT DETECTED NOT DETECTED Final   Salmonella species NOT DETECTED NOT DETECTED Final   Serratia marcescens NOT DETECTED NOT DETECTED Final   Haemophilus influenzae NOT DETECTED NOT DETECTED Final   Neisseria meningitidis NOT DETECTED NOT DETECTED Final   Pseudomonas aeruginosa NOT DETECTED NOT DETECTED Final   Stenotrophomonas maltophilia NOT DETECTED NOT DETECTED  Final   Candida albicans NOT DETECTED NOT DETECTED Final   Candida auris NOT DETECTED NOT DETECTED Final   Candida glabrata NOT DETECTED NOT DETECTED Final   Candida krusei NOT DETECTED NOT DETECTED Final   Candida parapsilosis NOT DETECTED NOT DETECTED Final   Candida tropicalis NOT DETECTED NOT DETECTED Final   Cryptococcus neoformans/gattii NOT DETECTED NOT DETECTED Final    Comment: Performed at [facility], [address] [place], [place] [address]  Aerobic/Anaerobic Culture w Gram Stain (surgical/deep wound)     Status: None (Preliminary result)   Collection Time: 12/20/23  3:57 PM   Specimen: Wound; Abscess  Result Value Ref Range Status   Specimen Description   Final    WOUND Performed at [facility], [address], [place], [place] [address]    Special Requests   Final    NONE Performed at [facility], [address] [place], [place] [address]    Gram Stain   Final    MODERATE WBC PRESENT, PREDOMINANTLY PMN NO ORGANISMS SEEN Performed at [facility], [address] [place], [place] [address]    Culture   Final    FEW BURKHOLDERIA CEPACIA SUSCEPTIBILITIES TO FOLLOW NO ANAEROBES ISOLATED; CULTURE IN PROGRESS FOR 5 DAYS    Report Status PENDING  Incomplete  Culture, blood (Routine X 2) w Reflex to ID Panel     Status: None (Preliminary result)   Collection Time: 12/21/23  5:23 PM   Specimen: BLOOD  Result Value Ref Range Status   Specimen Description BLOOD BLOOD LEFT ARM  Final   Special Requests   Final    BOTTLES DRAWN AEROBIC ONLY Blood Culture adequate volume   Culture   Final    NO GROWTH < 24 HOURS Performed at [facility], [address] [place], [place] [address]    Report Status PENDING  Incomplete  Culture, blood (Routine X 2) w Reflex to ID Panel     Status: None (Preliminary result)   Collection Time: 12/21/23  5:23 PM   Specimen: BLOOD  Result Value Ref Range Status   Specimen Description BLOOD  BLOOD LEFT HAND  Final   Special Requests   Final    BOTTLES DRAWN AEROBIC AND ANAEROBIC Blood Culture adequate volume   Culture   Final  NO GROWTH < 12 HOURS Performed at [facility], [address], [place], [place] [address]    Report Status PENDING  Incomplete         Radiology Studies: No results found.       Scheduled Meds:  acetaminophen  1,000 mg Oral QID   ketorolac  30 mg Intravenous Q6H   lidocaine  1 patch Transdermal Q24H   methadone  90 mg Oral Daily   polyethylene glycol  17 g Oral Daily   sodium chloride flush  3 mL Intravenous Q12H   Continuous Infusions:  ceFEPime (MAXIPIME) IV       LOS: 2 days    Time spent: 35 minutes    Ruben Lovett, MD [facility] Pager [phone]  If 7PM-7AM, please contact night-coverage [email or web address]  12/22/2023, 5:51 PM

## 2023-12-22 NOTE — Plan of Care (Signed)
  Problem: Education: Goal: Knowledge of General Education information will improve Description: Including pain rating scale, medication(s)/side effects and non-pharmacologic comfort measures Outcome: Progressing   Problem: Clinical Measurements: Goal: Ability to maintain clinical measurements within normal limits will improve Outcome: Progressing Goal: Will remain free from infection Outcome: Progressing Goal: Diagnostic test results will improve Outcome: Progressing Goal: Respiratory complications will improve Outcome: Progressing Goal: Cardiovascular complication will be avoided Outcome: Progressing   Problem: Pain Managment: Goal: General experience of comfort will improve and/or be controlled Outcome: Progressing   Problem: Safety: Goal: Ability to remain free from injury will improve Outcome: Progressing

## 2023-12-22 NOTE — Plan of Care (Signed)
Palliative Care consult for Pain and Symptom Management has been received and chart reviewed by team and with PMT attending. This patient does not meet the criteria for IP Palliative Medicine consult due to lack of life-limiting illness.  Given his recreational drug use, would recommend a psychiatry evaluation with medically managed treatment and detox, or referral to chronic pain management clinic.      If the patient's condition or needs change, please call [phone] for re-consultation or to speak with a member of our team for assistance.  [facility]

## 2023-12-22 NOTE — Plan of Care (Signed)

## 2023-12-22 NOTE — Progress Notes (Signed)
Date of Admission:  12/19/2023    ID: Ruben Turner is a 48 y.o. male  Principal Problem:   Lytic bone lesions (T8 -10) Active Problems:   Opioid use disorder   Pulmonary mass   Pathologic thoracic fracture   Spinal stenosis, thoracic   Myelomalacia (HCC)   Spinal instabilities of thoracic region   Lytic lesion of bone on x-ray    Subjective: Pt is having back pain Mid thoracic pain radiates to the front   Medications:   acetaminophen  1,000 mg Oral QID   ketorolac  30 mg Intravenous Q6H   lidocaine  1 patch Transdermal Q24H   methadone  90 mg Oral Daily   polyethylene glycol  17 g Oral Daily   sodium chloride flush  3 mL Intravenous Q12H    Objective: Vital signs in last 24 hours: Patient Vitals for the past 24 hrs:  BP Temp Temp src Pulse Resp SpO2  12/22/23 1029 116/77 98.5 F (36.9 C) Oral -- -- --  12/22/23 0831 (!) 144/98 99.5 F (37.5 C) -- 93 18 100 %  12/22/23 0608 -- -- -- -- 16 --  12/22/23 0454 -- -- -- -- 16 --  12/22/23 0254 -- -- -- -- 16 --  12/22/23 0209 -- -- -- -- 16 --  12/21/23 2224 -- -- -- -- 16 --  12/21/23 2215 115/79 98.2 F (36.8 C) -- 85 17 99 %  12/21/23 2139 -- -- -- -- 16 --  12/21/23 1956 -- -- -- -- 16 --  12/21/23 1550 115/88 98.4 F (36.9 C) -- 80 16 100 %      PHYSICAL EXAM:  General: Alert, cooperative, cheerful, emaciated, pale Head: Normocephalic, without obvious abnormality, atraumatic. Eyes: Conjunctivae clear, anicteric sclerae. Pupils are equal ENT Nares normal. No drainage or sinus tenderness. Lips, mucosa, and tongue normal. No Thrush , poor dentition Neck: Supple, symmetrical, no adenopathy, thyroid: non tender no carotid bruit and no JVD. Back: Gibbus mid spine Lungs: Clear to auscultation bilaterally. No Wheezing or Rhonchi. No rales. Heart: Regular rate and rhythm, no murmur, rub or gallop. Abdomen: Soft, non-tender,not distended. Bowel sounds normal. No masses Extremities: atraumatic, no  cyanosis. No edema. No clubbing Skin: No rashes or lesions. Or bruising Lymph: Cervical, supraclavicular normal. Neurologic: Grossly non-focal  Lab Results    Latest Ref Rng & Units 12/21/2023    5:09 AM 12/20/2023    6:25 AM 12/19/2023   12:20 PM  CBC  WBC 4.0 - 10.5 K/uL 8.9  9.6  9.3   Hemoglobin 13.0 - 17.0 g/dL 96.2  95.2  84.1   Hematocrit 39.0 - 52.0 % 31.3  34.1  31.6   Platelets 150 - 400 K/uL 335  339  292        Latest Ref Rng & Units 12/21/2023    5:09 AM 12/20/2023    6:25 AM 12/19/2023   12:20 PM  CMP  Glucose 70 - 99 mg/dL 324  97  75   BUN 6 - 20 mg/dL 16  11  14    Creatinine 0.61 - 1.24 mg/dL 4.01  0.27  2.53   Sodium 135 - 145 mmol/L 133  132  136   Potassium 3.5 - 5.1 mmol/L 3.6  3.8  3.9   Chloride 98 - 111 mmol/L 101  97  99   CO2 22 - 32 mmol/L 23  23  23    Calcium 8.9 - 10.3 mg/dL 8.8  8.8  8.8  Total Protein 6.5 - 8.1 g/dL   7.1   Total Bilirubin 0.0 - 1.2 mg/dL   0.7   Alkaline Phos 38 - 126 U/L   65   AST 15 - 41 U/L   21   ALT 0 - 44 U/L   16       Microbiology: [facility] 1/14 staph species 1 of 4 1/16 repeat BC- NG so far' 12/20/23 Aspiration of disc space- gram neg rods ID not available yet Studies/Results: CT BONE TROCAR/NEEDLE BIOPSY DEEP Result Date: 12/20/2023 CLINICAL DATA:  Collapse of T9 with acute kyphosis T9-10, bony destruction of T9 with posterior displacement of fracture fragments EXAM: CT GUIDED DEEP  BONE ASPIRATION AND CORE BIOPSY TECHNIQUE: Patient was placed prone on the CT gantry and limited axial scans through the thorax were obtained. This exam was performed according to the departmental dose-optimization program which includes automated exposure control, adjustment of the mA and/or kV according to patient size and/or use of iterative reconstruction technique. Appropriate skin entry site was identified. Skin site was marked, prepped with chlorhexidine, draped in usual sterile fashion, and infiltrated locally with 1% lidocaine.  Intravenous Fentanyl and Versed 1.5mg  were administered by RN during a total moderate (conscious) sedation time of 26 minutes; the patient's level of consciousness and physiological / cardiorespiratory status were monitored continuously by radiology RN under my direct supervision. Under angled CT fluoroscopic guidance, initially a 22 gauge spinal needle was advanced into the T9-10 interspace from a right posterolateral approach in the region of bony destruction. A scant amount of bloody fluid was aspirated, sent for Gram stain and culture. Using the same approach, an 11-gauge Cook trocar bone needle was advanced into the T9-10 interspace posterior margin. Coaxial was obtained, also sent for Gram stain and culture. Subsequently, bone biopsy using the outer 11 gauge trocar needle was obtained, submitted to surgical pathology. Postprocedure scans show no hemorrhage or other apparent complication. No pneumothorax. Patient tolerated procedure well. COMPLICATIONS: COMPLICATIONS none IMPRESSION: 1. Technically successful CT guided thoracic spine T9-10 core and aspiration bone biopsy, sent for surgical pathology, Gram stain and culture. Electronically Signed   By: Corlis Leak M.D.   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 presenting with back pain- no fever T8-T9-T10 vertebral body destruction - subacute osteomyelitis  Elevated ESR Pt had disc aspiration and sent for culture Gram neg rod in culture- ID pending Will start cefepime Pathology was abscess  Pt is being transferred to [facility] for spine stabilizing surgery   Gram positive cocci  staph species in 1 of 4 bottle- likely contaminant  repeat blood culture neg so far        The vertebral body bone is to be sent for pathology and  for AFB, fungal and bacterial cultures    HEPC antibody positive IVDA Will check RNA On methadone  Anemia Hypoalbuminemia?   CT chest shows bronchitis/bronchopneumonia rt infrahilar   Discussed the  management with the patient and care team ID will follow him peripherally this weekend Call if needed

## 2023-12-22 NOTE — Consult Note (Signed)
Hematology/Oncology Consult note [facility] [phone] [phone]  Patient Care Team: Patient, No Pcp Per as PCP - General (General Practice)   Name of the patient: Ruben Turner  [id number]  08-Dec-1975    Reason for consult: Spine lesions concerning for malignancy   Requesting physician: Dr. Delfino Lovett  Date of visit: 12/22/2023    History of presenting illness- Patient is a 48 year old male with no significant past medical history who presented to the ER for symptoms of worsening back pain which has been ongoing for the last 2 months.  This pain was more severe than before and therefore he presented to the ER.  He has a history of IV drug use.  Patient had CT and MRI of the spine as well as CT chest abdomen and pelvis with contrast.   MRI showed lesions in T8-T9 and T10 with near complete collapse of T9 with 8 mm retropulsion of the posterior cortex enhancement of the left third rib possibly concerning for metastatic disease CT chest abdomen and pelvis with contrast showed right infrahilar peribronchial thickening in the right lower lobe concerning for bronchitis and pneumonia and underlying mass could not be excluded.  Patient has been seen by neurosurgery and plan is for IR guided biopsy to see if this is malignancy versus infection and to decide surgical course accordingly   Patient currently reports significant back pain But denies other complaints.  He admits to he IV drug use prior to his hospitalization  ECOG PS- 1  Pain scale- 8   Review of systems- Review of Systems  Musculoskeletal:  Positive for back pain.    Allergies  Allergen Reactions   Erythromycin    Penicillins     Chest tightness     Patient Active Problem List   Diagnosis Date Noted   Lytic lesion of bone on x-ray 12/20/2023   Lytic bone lesions (T8 -10) 12/19/2023   Opioid use disorder 12/19/2023   Pulmonary mass 12/19/2023   Pathologic thoracic  fracture 12/19/2023   Spinal stenosis, thoracic 12/19/2023   Myelomalacia (HCC) 12/19/2023   Spinal instabilities of thoracic region 12/19/2023   Post-traumatic osteoarthritis of right shoulder 04/21/2015   Chronic right shoulder pain 08/28/2014     History reviewed. No pertinent past medical history.   Past Surgical History:  Procedure Laterality Date   KNEE SURGERY Left    SHOULDER ARTHROSCOPY Right     Social History   Socioeconomic History   Marital status: Single    Spouse name: Not on file   Number of children: Not on file   Years of education: Not on file   Highest education level: Not on file  Occupational History   Not on file  Tobacco Use   Smoking status: Every Day    Types: Cigarettes   Smokeless tobacco: Never  Vaping Use   Vaping status: Never Used  Substance and Sexual Activity   Alcohol use: No   Drug use: Not on file   Sexual activity: Not on file  Other Topics Concern   Not on file  Social History Narrative   Not on file   Social Drivers of Health   Financial Resource Strain: Not on file  Food Insecurity: No Food Insecurity (12/20/2023)   Hunger Vital Sign    Worried About Running Out of Food in the Last Year: Never true    Ran Out of Food in the Last Year: Never true  Transportation Needs: No Transportation Needs (12/20/2023)  PRAPARE - Administrator, Civil Service (Medical): No    Lack of Transportation (Non-Medical): No  Physical Activity: Not on file  Stress: Not on file  Social Connections: Not on file  Intimate Partner Violence: Not At Risk (12/20/2023)   Humiliation, Afraid, Rape, and Kick questionnaire    Fear of Current or Ex-Partner: No    Emotionally Abused: No    Physically Abused: No    Sexually Abused: No     History reviewed. No pertinent family history.   Current Facility-Administered Medications:    acetaminophen (TYLENOL) tablet 1,000 mg, 1,000 mg, Oral, QID, Verdene Lennert, MD, 1,000 mg at 12/21/23  2138   fentaNYL (SUBLIMAZE) injection 50 mcg, 50 mcg, Intravenous, Q2H PRN, Verdene Lennert, MD, 50 mcg at 12/22/23 0454   ketorolac (TORADOL) 30 MG/ML injection 30 mg, 30 mg, Intravenous, Q6H, Verdene Lennert, MD, 30 mg at 12/22/23 0608   lidocaine (LIDODERM) 5 % 1 patch, 1 patch, Transdermal, Q24H, Verdene Lennert, MD, 1 patch at 12/21/23 2139   methadone (DOLOPHINE) tablet 90 mg, 90 mg, Oral, Daily, Delfino Lovett, MD, 90 mg at 12/21/23 0865   nicotine polacrilex (NICORETTE) gum 2 mg, 2 mg, Oral, PRN, Verdene Lennert, MD, 2 mg at 12/20/23 2211   oxyCODONE (Oxy IR/ROXICODONE) immediate release tablet 10 mg, 10 mg, Oral, Q4H PRN, Delfino Lovett, MD, 10 mg at 12/22/23 0607   polyethylene glycol (MIRALAX / GLYCOLAX) packet 17 g, 17 g, Oral, Daily, Verdene Lennert, MD, 17 g at 12/21/23 1212   sodium chloride flush (NS) 0.9 % injection 3 mL, 3 mL, Intravenous, Q12H, Verdene Lennert, MD, 3 mL at 12/21/23 2139   traZODone (DESYREL) tablet 50 mg, 50 mg, Oral, QHS PRN, Verdene Lennert, MD, 50 mg at 12/21/23 2138   Physical exam:  Vitals:   12/22/23 0254 12/22/23 0454 12/22/23 0608 12/22/23 0831  BP:    (!) 144/98  Pulse:    93  Resp: 16 16 16 18   Temp:    99.5 F (37.5 C)  TempSrc:      SpO2:    100%  Weight:      Height:       Physical Exam HENT:     Head: Atraumatic.  Cardiovascular:     Rate and Rhythm: Normal rate and regular rhythm.     Heart sounds: Normal heart sounds.  Pulmonary:     Effort: Pulmonary effort is normal.     Breath sounds: Normal breath sounds.  Abdominal:     General: Bowel sounds are normal.     Palpations: Abdomen is soft.  Skin:    General: Skin is warm and dry.  Neurological:     General: No focal deficit present.     Mental Status: He is alert and oriented to person, place, and time.           Latest Ref Rng & Units 12/21/2023    5:09 AM  CMP  Glucose 70 - 99 mg/dL 784   BUN 6 - 20 mg/dL 16   Creatinine 6.96 - 1.24 mg/dL 2.95   Sodium 284 - 132  mmol/L 133   Potassium 3.5 - 5.1 mmol/L 3.6   Chloride 98 - 111 mmol/L 101   CO2 22 - 32 mmol/L 23   Calcium 8.9 - 10.3 mg/dL 8.8       Latest Ref Rng & Units 12/21/2023    5:09 AM  CBC  WBC 4.0 - 10.5 K/uL 8.9   Hemoglobin 13.0 -  17.0 g/dL 16.1   Hematocrit 09.6 - 52.0 % 31.3   Platelets 150 - 400 K/uL 335     @IMAGES @  CT BONE TROCAR/NEEDLE BIOPSY DEEP Result Date: 12/20/2023 CLINICAL DATA:  Collapse of T9 with acute kyphosis T9-10, bony destruction of T9 with posterior displacement of fracture fragments EXAM: CT GUIDED DEEP  BONE ASPIRATION AND CORE BIOPSY TECHNIQUE: Patient was placed prone on the CT gantry and limited axial scans through the thorax were obtained. This exam was performed according to the departmental dose-optimization program which includes automated exposure control, adjustment of the mA and/or kV according to patient size and/or use of iterative reconstruction technique. Appropriate skin entry site was identified. Skin site was marked, prepped with chlorhexidine, draped in usual sterile fashion, and infiltrated locally with 1% lidocaine. Intravenous Fentanyl and Versed 1.5mg  were administered by RN during a total moderate (conscious) sedation time of 26 minutes; the patient's level of consciousness and physiological / cardiorespiratory status were monitored continuously by radiology RN under my direct supervision. Under angled CT fluoroscopic guidance, initially a 22 gauge spinal needle was advanced into the T9-10 interspace from a right posterolateral approach in the region of bony destruction. A scant amount of bloody fluid was aspirated, sent for Gram stain and culture. Using the same approach, an 11-gauge Cook trocar bone needle was advanced into the T9-10 interspace posterior margin. Coaxial was obtained, also sent for Gram stain and culture. Subsequently, bone biopsy using the outer 11 gauge trocar needle was obtained, submitted to surgical pathology.  Postprocedure scans show no hemorrhage or other apparent complication. No pneumothorax. Patient tolerated procedure well. COMPLICATIONS: COMPLICATIONS none IMPRESSION: 1. Technically successful CT guided thoracic spine T9-10 core and aspiration bone biopsy, sent for surgical pathology, Gram stain and culture. Electronically Signed   By: Corlis Leak M.D.   On: 12/20/2023 16:21   CT CHEST ABDOMEN PELVIS W CONTRAST Result Date: 12/19/2023 CLINICAL DATA:  Back pain.  Evaluation of metastatic disease. EXAM: CT CHEST, ABDOMEN, AND PELVIS WITH CONTRAST TECHNIQUE: Multidetector CT imaging of the chest, abdomen and pelvis was performed following the standard protocol during bolus administration of intravenous contrast. RADIATION DOSE REDUCTION: This exam was performed according to the departmental dose-optimization program which includes automated exposure control, adjustment of the mA and/or kV according to patient size and/or use of iterative reconstruction technique. CONTRAST:  OMNIPAQUE IOHEXOL 300 MG/ML  SOLN COMPARISON:  Thoracic and lumbar spine CT and MRI dated 12/19/2023. FINDINGS: CT CHEST FINDINGS Cardiovascular: There is no cardiomegaly or pericardial effusion. The thoracic aorta is unremarkable. The origins of the great vessels of the aortic arch and the central pulmonary arteries appear patent. Mediastinum/Nodes: No hilar or mediastinal adenopathy. The esophagus is grossly unremarkable. No mediastinal fluid collection. Lungs/Pleura: There is right infrahilar peribronchial thickening and airspace density in the right lower lobe concerning for bronchitis and pneumonia. An underlying mass is less likely but not excluded. Clinical correlation and follow-up to resolution recommended. There is a 6 mm subpleural nodule or para fissural lymph node along the left fissure. Additional nodules along the right major fissure measure up to 4 mm. No pleural effusion or pneumothorax. The central airways remain patent.  Musculoskeletal: Degenerative changes of the shoulders. Minimally displaced fracture of the posterior right eighth rib. Nondisplaced fracture of the right T8 spinous process. Destructive changes and complete collapse of T9 with posterior buckling. See report of the CT and MRI of the spine for detailed evaluation. CT ABDOMEN PELVIS FINDINGS No intra-abdominal free air  or free fluid. Hepatobiliary: Subcentimeter hepatic hypodense lesions are too small to characterize. No biliary dilatation. The gallbladder is unremarkable. Pancreas: The pancreas is grossly unremarkable as visualized. Spleen: Normal in size without focal abnormality. Adrenals/Urinary Tract: The adrenal glands are unremarkable. The kidneys, visualized ureters, and urinary bladder appear unremarkable. Stomach/Bowel: Evaluation of the bowel is limited in the absence of oral contrast and due to paucity of intra-abdominal fat. There is moderate amount of stool throughout the colon. There is no bowel obstruction or active inflammation. The appendix is not visualized with certainty. No inflammatory changes identified in the right lower quadrant. Vascular/Lymphatic: Mild atherosclerotic calcification of the abdominal aorta. The IVC is unremarkable. No portal venous gas. There is no adenopathy. Reproductive: The prostate and seminal vesicles are grossly unremarkable. No pelvic mass. Other: There is loss of subcutaneous fat and cachexia. Musculoskeletal: Degenerative changes of the spine and multilevel disc desiccation and vacuum phenomena. No acute osseous pathology. IMPRESSION: 1. Right infrahilar peribronchial thickening and airspace density in the right lower lobe concerning for bronchitis and pneumonia. An underlying mass is less likely but not excluded. Clinical correlation and follow-up to resolution recommended. 2. Minimally displaced fracture of the posterior right eighth rib. Nondisplaced fracture of the right T8 spinous process. 3. Destructive changes  and complete collapse of T9 with posterior buckling. See reports for the CT and MRI of the spine. 4. No acute intra-abdominal or pelvic pathology. 5. Moderate colonic stool burden. No bowel obstruction. 6.  Aortic Atherosclerosis (ICD10-I70.0). Electronically Signed   By: Elgie Collard M.D.   On: 12/19/2023 17:05   MR Cervical Spine W and Wo Contrast Result Date: 12/19/2023 CLINICAL DATA:  Bone mass or bone pain, pathologic fracture on same-day CT thoracic spine EXAM: MRI CERVICAL, THORACIC AND LUMBAR SPINE WITHOUT AND WITH CONTRAST TECHNIQUE: Multiplanar and multiecho pulse sequences of the cervical spine, to include the craniocervical junction and cervicothoracic junction, and thoracic and lumbar spine, were obtained without and with intravenous contrast. CONTRAST:  7mL GADAVIST GADOBUTROL 1 MMOL/ML IV SOLN COMPARISON:  08/09/2011 MRI lumbar spine, no prior MRI of the cervical or thoracic spine; correlation is made with CT thoracic and lumbar spine 12/19/2023; no prior CT of the cervical spine available. FINDINGS: Evaluation is somewhat limited by motion, particularly on the postcontrast images. In addition, the patient could not complete the postcontrast imaging. No post-contrast axial could be obtained on the cervical and lumbar spine. MRI CERVICAL SPINE FINDINGS Alignment: No significant listhesis. Vertebrae: No acute fracture, evidence of discitis, or suspicious osseous lesion. Cord: Normal in signal and morphology. Posterior Fossa, vertebral arteries, paraspinal tissues: Low lying cerebellar tonsils, which extend approximately 4 mm below the foramen magnum and do not meet criteria for Chiari 1 malformation. Normal vertebral artery flow voids. Disc levels: C2-C3: No significant disc bulge. No spinal canal stenosis or neuroforaminal narrowing. C3-C4: No significant disc bulge. Uncovertebral hypertrophy. No spinal canal stenosis. Mild right neural foraminal narrowing. C4-C5: Disc height loss and minimal  disc bulge. Facet and uncovertebral hypertrophy. No spinal canal stenosis. Severe left and mild right neural foraminal narrowing. C5-C6: Disc height loss. Facet and uncovertebral hypertrophy. No spinal canal stenosis. Severe left and moderate right neural foraminal narrowing. C6-C7: Left paracentral disc protrusion. Facet arthropathy. No spinal canal stenosis or neural foraminal narrowing. C7-T1: No significant disc bulge. Facet arthropathy. No spinal canal stenosis. Mild right neural foraminal narrowing. MRI THORACIC SPINE FINDINGS Alignment: Focal kyphosis centered on the T8, T9, and T10 vertebral body fractures. Otherwise normal alignment. Vertebrae:  Redemonstrated destruction of the majority of the T9 vertebral body, for which only a portion of the posterior cortex is seen. The posterior cortex extends approximately 8 mm into the spinal canal. Significant loss of the T8 vertebral body, with only 60% vertebral body height at the anterior aspect and 50% of the vertebral body present at the posterior aspect. At the superior aspect of T10, there is approximately 20% vertebral body height loss. Decreased T1 and increased T2 signal is seen throughout the T8, T9, and T10 vertebral bodies, with abnormal signal extending into posterior elements (series 19, image 5 and 17). Although postcontrast imaging is of limited utility, there appears to be diffuse enhancement in this area (series 24, image 10). Similar abnormal signal in the left third rib (series 10, image 20), which also appears to enhance (series 24, image 28). Cord: Mild cord deformation at T9, without abnormal spinal cord signal. No abnormal enhancement. Paraspinal and other soft tissues: Masslike enhancement in the medial right lung, possibly at the right hilum (series 23, image 28). Disc levels: Moderate spinal canal stenosis posterior to T8-T9 through T9-T10, secondary to retropulsion (series 20, image 30). Severe bilateral neural foraminal narrowing at  T8-T9 and T9-T10. MRI LUMBAR SPINE FINDINGS Segmentation:  5 lumbar type vertebral bodies. Alignment: Dextroscoliosis. Trace retrolisthesis of L1 on L2. Trace anterolisthesis of L4 on L5. Vertebrae: No acute fracture, evidence of discitis, or suspicious osseous lesion. Endplate degenerative changes, most prominent at L3-L4, eccentric to the left and at L5-S1, eccentric to the right. No abnormal enhancement. Congenitally short pedicles, which narrow the AP diameter of the spinal canal. Conus medullaris and cauda equina: Conus extends to the L2 level. Conus and cauda equina appear normal. Paraspinal and other soft tissues:  No acute finding. Disc levels: T12-L1: No significant disc bulge. No spinal canal stenosis or neural foraminal narrowing. L1-L2: No significant disc bulge. Mild facet arthropathy. Trace retrolisthesis. No spinal canal stenosis. Mild left neural foraminal narrowing. L2-L3: Minimal disc bulge. Mild facet arthropathy. No spinal canal stenosis or neural foraminal narrowing. L3-L4: Mild disc bulge. Mild-to-moderate facet arthropathy. Narrowing of the lateral recesses. Mild spinal canal stenosis. Moderate left and mild right neural foraminal narrowing. L4-L5: Trace anterolisthesis with disc unroofing and mild disc bulge. Severe facet arthropathy. Narrowing of the lateral recesses. Mild-to-moderate spinal canal stenosis. Mild bilateral neural foraminal narrowing. L5-S1: Minimal disc bulge with right eccentric disc osteophyte complex, which may contact the exiting right L5 nerve roots. Moderate right and mild left facet arthropathy. No spinal canal stenosis. Moderate right neural foraminal narrowing. IMPRESSION: 1. Evaluation is somewhat limited by motion and the patient could not complete the postcontrast imaging. No post-contrast axial could be obtained on the cervical and lumbar spine. 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 No cord signal abnormality. 2. Abnormal signal and enhancement in the left third rib. Findings are concerning for additional site of metastatic disease. 3. Masslike enhancement in the medial right lung, possibly at the right hilum, concerning for a primary neoplastic process or metastatic disease. Recommend further evaluation with a CT of the chest with contrast. 4. L3-L4 mild spinal canal stenosis with moderate left and mild right neural foraminal narrowing. Narrowing of the lateral recesses at this level could affect the descending L4 nerve roots. 5. L4-L5 mild-to-moderate spinal canal stenosis and mild bilateral neural foraminal narrowing. Narrowing of the  lateral recesses at this level could affect the descending L5 nerve roots. 6. L5-S1 moderate right neural foraminal narrowing. 7. C4-C5 severe left and mild right neural foraminal narrowing. 8. C5-C6 severe left and moderate right neural foraminal narrowing. Electronically Signed   By: Wiliam Ke M.D.   On: 12/19/2023 14:24   MR THORACIC SPINE W WO CONTRAST Result Date: 12/19/2023 CLINICAL DATA:  Bone mass or bone pain, pathologic fracture on same-day CT thoracic spine EXAM: MRI CERVICAL, THORACIC AND LUMBAR SPINE WITHOUT AND WITH CONTRAST TECHNIQUE: Multiplanar and multiecho pulse sequences of the cervical spine, to include the craniocervical junction and cervicothoracic junction, and thoracic and lumbar spine, were obtained without and with intravenous contrast. CONTRAST:  7mL GADAVIST GADOBUTROL 1 MMOL/ML IV SOLN COMPARISON:  08/09/2011 MRI lumbar spine, no prior MRI of the cervical or thoracic spine; correlation is made with CT thoracic and lumbar spine 12/19/2023; no prior CT of the cervical spine available. FINDINGS: Evaluation is somewhat limited by motion, particularly on the postcontrast images. In addition, the patient could not complete the  postcontrast imaging. No post-contrast axial could be obtained on the cervical and lumbar spine. MRI CERVICAL SPINE FINDINGS Alignment: No significant listhesis. Vertebrae: No acute fracture, evidence of discitis, or suspicious osseous lesion. Cord: Normal in signal and morphology. Posterior Fossa, vertebral arteries, paraspinal tissues: Low lying cerebellar tonsils, which extend approximately 4 mm below the foramen magnum and do not meet criteria for Chiari 1 malformation. Normal vertebral artery flow voids. Disc levels: C2-C3: No significant disc bulge. No spinal canal stenosis or neuroforaminal narrowing. C3-C4: No significant disc bulge. Uncovertebral hypertrophy. No spinal canal stenosis. Mild right neural foraminal narrowing. C4-C5: Disc height loss and minimal disc bulge. Facet and uncovertebral hypertrophy. No spinal canal stenosis. Severe left and mild right neural foraminal narrowing. C5-C6: Disc height loss. Facet and uncovertebral hypertrophy. No spinal canal stenosis. Severe left and moderate right neural foraminal narrowing. C6-C7: Left paracentral disc protrusion. Facet arthropathy. No spinal canal stenosis or neural foraminal narrowing. C7-T1: No significant disc bulge. Facet arthropathy. No spinal canal stenosis. Mild right neural foraminal narrowing. MRI THORACIC SPINE FINDINGS Alignment: Focal kyphosis centered on the T8, T9, and T10 vertebral body fractures. Otherwise normal alignment. Vertebrae: Redemonstrated destruction of the majority of the T9 vertebral body, for which only a portion of the posterior cortex is seen. The posterior cortex extends approximately 8 mm into the spinal canal. Significant loss of the T8 vertebral body, with only 60% vertebral body height at the anterior aspect and 50% of the vertebral body present at the posterior aspect. At the superior aspect of T10, there is approximately 20% vertebral body height loss. Decreased T1 and increased T2 signal is seen throughout the  T8, T9, and T10 vertebral bodies, with abnormal signal extending into posterior elements (series 19, image 5 and 17). Although postcontrast imaging is of limited utility, there appears to be diffuse enhancement in this area (series 24, image 10). Similar abnormal signal in the left third rib (series 10, image 20), which also appears to enhance (series 24, image 28). Cord: Mild cord deformation at T9, without abnormal spinal cord signal. No abnormal enhancement. Paraspinal and other soft tissues: Masslike enhancement in the medial right lung, possibly at the right hilum (series 23, image 28). Disc levels: Moderate spinal canal stenosis posterior to T8-T9 through T9-T10, secondary to retropulsion (series 20, image 30). Severe bilateral neural foraminal narrowing at T8-T9 and T9-T10. MRI LUMBAR SPINE FINDINGS Segmentation:  5 lumbar type vertebral bodies. Alignment:  Dextroscoliosis. Trace retrolisthesis of L1 on L2. Trace anterolisthesis of L4 on L5. Vertebrae: No acute fracture, evidence of discitis, or suspicious osseous lesion. Endplate degenerative changes, most prominent at L3-L4, eccentric to the left and at L5-S1, eccentric to the right. No abnormal enhancement. Congenitally short pedicles, which narrow the AP diameter of the spinal canal. Conus medullaris and cauda equina: Conus extends to the L2 level. Conus and cauda equina appear normal. Paraspinal and other soft tissues:  No acute finding. Disc levels: T12-L1: No significant disc bulge. No spinal canal stenosis or neural foraminal narrowing. L1-L2: No significant disc bulge. Mild facet arthropathy. Trace retrolisthesis. No spinal canal stenosis. Mild left neural foraminal narrowing. L2-L3: Minimal disc bulge. Mild facet arthropathy. No spinal canal stenosis or neural foraminal narrowing. L3-L4: Mild disc bulge. Mild-to-moderate facet arthropathy. Narrowing of the lateral recesses. Mild spinal canal stenosis. Moderate left and mild right neural foraminal  narrowing. L4-L5: Trace anterolisthesis with disc unroofing and mild disc bulge. Severe facet arthropathy. Narrowing of the lateral recesses. Mild-to-moderate spinal canal stenosis. Mild bilateral neural foraminal narrowing. L5-S1: Minimal disc bulge with right eccentric disc osteophyte complex, which may contact the exiting right L5 nerve roots. Moderate right and mild left facet arthropathy. No spinal canal stenosis. Moderate right neural foraminal narrowing. IMPRESSION: 1. Evaluation is somewhat limited by motion and the patient could not complete the postcontrast imaging. No post-contrast axial could be obtained on the cervical and lumbar spine. 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 No cord signal abnormality. 2. Abnormal signal and enhancement in the left third rib. Findings are concerning for additional site of metastatic disease. 3. Masslike enhancement in the medial right lung, possibly at the right hilum, concerning for a primary neoplastic process or metastatic disease. Recommend further evaluation with a CT of the chest with contrast. 4. L3-L4 mild spinal canal stenosis with moderate left and mild right neural foraminal narrowing. Narrowing of the lateral recesses at this level could affect the descending L4 nerve roots. 5. L4-L5 mild-to-moderate spinal canal stenosis and mild bilateral neural foraminal narrowing. Narrowing of the lateral recesses at this level could affect the descending L5 nerve roots. 6. L5-S1 moderate right neural foraminal narrowing. 7. C4-C5 severe left and mild right neural foraminal narrowing. 8. C5-C6 severe left and moderate right neural foraminal narrowing. Electronically Signed   By: Wiliam Ke M.D.   On: 12/19/2023 14:24   MR Lumbar Spine W Wo Contrast Result Date: 12/19/2023 CLINICAL DATA:  Bone  mass or bone pain, pathologic fracture on same-day CT thoracic spine EXAM: MRI CERVICAL, THORACIC AND LUMBAR SPINE WITHOUT AND WITH CONTRAST TECHNIQUE: Multiplanar and multiecho pulse sequences of the cervical spine, to include the craniocervical junction and cervicothoracic junction, and thoracic and lumbar spine, were obtained without and with intravenous contrast. CONTRAST:  7mL GADAVIST GADOBUTROL 1 MMOL/ML IV SOLN COMPARISON:  08/09/2011 MRI lumbar spine, no prior MRI of the cervical or thoracic spine; correlation is made with CT thoracic and lumbar spine 12/19/2023; no prior CT of the cervical spine available. FINDINGS: Evaluation is somewhat limited by motion, particularly on the postcontrast images. In addition, the patient could not complete the postcontrast imaging. No post-contrast axial could be obtained on the cervical and lumbar spine. MRI CERVICAL SPINE FINDINGS Alignment: No significant listhesis. Vertebrae: No acute fracture, evidence of discitis, or suspicious osseous lesion. Cord: Normal in  signal and morphology. Posterior Fossa, vertebral arteries, paraspinal tissues: Low lying cerebellar tonsils, which extend approximately 4 mm below the foramen magnum and do not meet criteria for Chiari 1 malformation. Normal vertebral artery flow voids. Disc levels: C2-C3: No significant disc bulge. No spinal canal stenosis or neuroforaminal narrowing. C3-C4: No significant disc bulge. Uncovertebral hypertrophy. No spinal canal stenosis. Mild right neural foraminal narrowing. C4-C5: Disc height loss and minimal disc bulge. Facet and uncovertebral hypertrophy. No spinal canal stenosis. Severe left and mild right neural foraminal narrowing. C5-C6: Disc height loss. Facet and uncovertebral hypertrophy. No spinal canal stenosis. Severe left and moderate right neural foraminal narrowing. C6-C7: Left paracentral disc protrusion. Facet arthropathy. No spinal canal stenosis or neural foraminal narrowing. C7-T1: No  significant disc bulge. Facet arthropathy. No spinal canal stenosis. Mild right neural foraminal narrowing. MRI THORACIC SPINE FINDINGS Alignment: Focal kyphosis centered on the T8, T9, and T10 vertebral body fractures. Otherwise normal alignment. Vertebrae: Redemonstrated destruction of the majority of the T9 vertebral body, for which only a portion of the posterior cortex is seen. The posterior cortex extends approximately 8 mm into the spinal canal. Significant loss of the T8 vertebral body, with only 60% vertebral body height at the anterior aspect and 50% of the vertebral body present at the posterior aspect. At the superior aspect of T10, there is approximately 20% vertebral body height loss. Decreased T1 and increased T2 signal is seen throughout the T8, T9, and T10 vertebral bodies, with abnormal signal extending into posterior elements (series 19, image 5 and 17). Although postcontrast imaging is of limited utility, there appears to be diffuse enhancement in this area (series 24, image 10). Similar abnormal signal in the left third rib (series 10, image 20), which also appears to enhance (series 24, image 28). Cord: Mild cord deformation at T9, without abnormal spinal cord signal. No abnormal enhancement. Paraspinal and other soft tissues: Masslike enhancement in the medial right lung, possibly at the right hilum (series 23, image 28). Disc levels: Moderate spinal canal stenosis posterior to T8-T9 through T9-T10, secondary to retropulsion (series 20, image 30). Severe bilateral neural foraminal narrowing at T8-T9 and T9-T10. MRI LUMBAR SPINE FINDINGS Segmentation:  5 lumbar type vertebral bodies. Alignment: Dextroscoliosis. Trace retrolisthesis of L1 on L2. Trace anterolisthesis of L4 on L5. Vertebrae: No acute fracture, evidence of discitis, or suspicious osseous lesion. Endplate degenerative changes, most prominent at L3-L4, eccentric to the left and at L5-S1, eccentric to the right. No abnormal  enhancement. Congenitally short pedicles, which narrow the AP diameter of the spinal canal. Conus medullaris and cauda equina: Conus extends to the L2 level. Conus and cauda equina appear normal. Paraspinal and other soft tissues:  No acute finding. Disc levels: T12-L1: No significant disc bulge. No spinal canal stenosis or neural foraminal narrowing. L1-L2: No significant disc bulge. Mild facet arthropathy. Trace retrolisthesis. No spinal canal stenosis. Mild left neural foraminal narrowing. L2-L3: Minimal disc bulge. Mild facet arthropathy. No spinal canal stenosis or neural foraminal narrowing. L3-L4: Mild disc bulge. Mild-to-moderate facet arthropathy. Narrowing of the lateral recesses. Mild spinal canal stenosis. Moderate left and mild right neural foraminal narrowing. L4-L5: Trace anterolisthesis with disc unroofing and mild disc bulge. Severe facet arthropathy. Narrowing of the lateral recesses. Mild-to-moderate spinal canal stenosis. Mild bilateral neural foraminal narrowing. L5-S1: Minimal disc bulge with right eccentric disc osteophyte complex, which may contact the exiting right L5 nerve roots. Moderate right and mild left facet arthropathy. No spinal canal stenosis. Moderate right neural foraminal narrowing.  IMPRESSION: 1. Evaluation is somewhat limited by motion and the patient could not complete the postcontrast imaging. No post-contrast axial could be obtained on the cervical and lumbar spine. 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 No cord signal abnormality. 2. Abnormal signal and enhancement in the left third rib. Findings are concerning for additional site of metastatic disease. 3. Masslike enhancement in the medial right lung, possibly at the right hilum, concerning for a primary neoplastic process or metastatic disease.  Recommend further evaluation with a CT of the chest with contrast. 4. L3-L4 mild spinal canal stenosis with moderate left and mild right neural foraminal narrowing. Narrowing of the lateral recesses at this level could affect the descending L4 nerve roots. 5. L4-L5 mild-to-moderate spinal canal stenosis and mild bilateral neural foraminal narrowing. Narrowing of the lateral recesses at this level could affect the descending L5 nerve roots. 6. L5-S1 moderate right neural foraminal narrowing. 7. C4-C5 severe left and mild right neural foraminal narrowing. 8. C5-C6 severe left and moderate right neural foraminal narrowing. Electronically Signed   By: Wiliam Ke M.D.   On: 12/19/2023 14:24   CT Lumbar Spine Wo Contrast Result Date: 12/19/2023 CLINICAL DATA:  Low back pain. Cancer suspected. Pain over the last 2 months. EXAM: CT LUMBAR SPINE WITHOUT CONTRAST TECHNIQUE: Multidetector CT imaging of the lumbar spine was performed without intravenous contrast administration. Multiplanar CT image reconstructions were also generated. RADIATION DOSE REDUCTION: This exam was performed according to the departmental dose-optimization program which includes automated exposure control, adjustment of the mA and/or kV according to patient size and/or use of iterative reconstruction technique. COMPARISON:  Thoracic CT same day FINDINGS: Segmentation: 5 lumbar type vertebral bodies. Alignment: Early Od a curvature convex to the right with the apex at L3. Degenerative anterolisthesis at L4-5 of 4 mm. Vertebrae: Degenerative endplate changes on the left at L3-4 and on the right at L4-5 and L5-S1 with sclerotic and early cystic changes. Cystic arthropathy present affecting the facet joints at L2-3, L3-4, L4-5 and L5-S1 as below. Paraspinal and other soft tissues: Negative. No evidence of adenopathy or mass lesion. Disc levels: T12-L1: Normal interspace. L1-2: Mild bulging of the disc more towards the left. No compressive stenosis.  L2-3: Disc degeneration more pronounced on the left. Mild bulging of the disc. No compressive stenosis. Mild facet arthropathy on the left. L3-4: Disc degeneration more pronounced on the left with disc space narrowing, sclerotic endplate changes and underlying cystic endplate changes. Circumferential bulging of the disc. Bilateral facet arthropathy with cystic changes. Moderate multifactorial spinal stenosis at this level that could be symptomatic, particularly on the left. L4-5: Facet arthropathy with cystic changes, worse on the right. Anterolisthesis of 4 mm. Disc degeneration more pronounced on the right with sclerotic and cystic endplate changes. Stenosis of the canal and lateral recesses that could cause neural compression, more likely on the right. L5-S1: Disc degeneration more pronounced on the right with vacuum phenomenon, endplate sclerotic changes and cystic changes. Facet arthropathy on the right with cystic changes. No central canal stenosis. Right foraminal stenosis that could affect the exiting right L5 nerve. IMPRESSION: 1. No evidence of malignancy. 2. Scoliosis convex to the right. 3. L3-4: Disc degeneration more pronounced on the left with disc space narrowing, sclerotic endplate changes and underlying cystic endplate changes. Circumferential bulging of the disc. Bilateral facet arthropathy with cystic  changes. Moderate multifactorial spinal stenosis at this level that could be symptomatic, particularly on the left. 4. L4-5: Facet arthropathy with cystic changes, worse on the right. Anterolisthesis of 4 mm. Disc degeneration more pronounced on the right with sclerotic and cystic endplate changes. Stenosis of the canal and lateral recesses that could cause neural compression, more likely on the right. 5. L5-S1: Disc degeneration more pronounced on the right with vacuum phenomenon, endplate sclerotic changes and cystic changes. Facet arthropathy on the right with cystic changes. Right foraminal  stenosis that could affect the exiting right L5 nerve. 6. The appearance of the endplate degenerative changes and the facet arthropathy is unusual for a person of this age, with prominent cystic changes. Findings could be seen with a systemic arthropathy such as CPPD or gout. The findings in this region are not specifically suggestive of tuberculosis of the spine as was suggested in the thoracic region. Electronically Signed   By: Paulina Fusi M.D.   On: 12/19/2023 11:37   CT Thoracic Spine Wo Contrast Result Date: 12/19/2023 CLINICAL DATA:  Bone lesion, thoracic spine, incidental. Back pain over the last 2 months. EXAM: CT THORACIC SPINE WITHOUT CONTRAST TECHNIQUE: Multidetector CT images of the thoracic were obtained using the standard protocol without intravenous contrast. RADIATION DOSE REDUCTION: This exam was performed according to the departmental dose-optimization program which includes automated exposure control, adjustment of the mA and/or kV according to patient size and/or use of iterative reconstruction technique. COMPARISON:  Lumbar MRI 08/09/2011 FINDINGS: Alignment: Kyphosis of the thoracic spine with the apex at T9-10, 55 degrees by measurement. Vertebrae: Lytic destruction of the T9 vertebral body. Posterior bowing of the posterior margin of the vertebral body. Lytic destruction of the inferior aspect of the T8 vertebral body in the superior aspect of the T10 vertebral body. There is an acute fracture of the right posterior eighth rib. There is a fracture of the transverse process on the right at T8. Paraspinal and other soft tissues: Some inflammatory changes of the posterior mediastinal soft tissues in the region from T8-T10. I do not identify definite soft tissue canal compromise, but MRI of the thoracic spine is recommended with and without contrast. Patient has obstruction/occlusion of the right middle lobe and lower lobe bronchi with volume loss of the lung in those regions. Disc levels:  No significant abnormality seen from T1 through T7 and from the T10-11 disc level to the lumbar region. IMPRESSION: 1. Lytic destruction of the T9 vertebral body with posterior bowing of the posterior margin of the vertebral body. Lytic destruction of the inferior aspect of the T8 vertebral body and the superior aspect of the T10 vertebral body. Acute fracture of the right posterior eighth rib. Fracture of the transverse process on the right at T8. Some surrounding soft tissue inflammatory change within the posterior mediastinum but no evidence of low-density abscess formation. Lung disease affecting the right middle and lower lobes. 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 This is a rare diagnosis and the differential diagnosis would also include metastatic disease and myeloma/plasmacytoma. Pyogenic spinal infection is less likely given the absence of an elevated white count and the absence of more pronounced surrounding soft tissue changes. Electronically Signed   By: Paulina Fusi M.D.   On: 12/19/2023 11:24    Assessment and plan- Patient is a 48 y.o. male admitted for compression fracture and lesions involving thoracic spine infection versus  malignancy  Patient underwent IR guided biopsy 2 days ago And prelim results are consistent with abscess and not malignancy.  Management will therefore be as per primary team and ID.  Patient noted to have right peribronchial thickening which could be secondary to pneumonia but underlying mass was not excluded.  He will need a repeat CT chest in about 6 to 8 weeks time to ensure resolution and this can be done by his PCP.  No follow-up needed with oncology at this time   Thank you for this kind referral and the opportunity to participate in the care of this patient   Visit Diagnosis: Thoracic spine lesions   Dr. Owens Shark, MD, MPH [facility] at [facility] [phone] 12/22/2023

## 2023-12-22 NOTE — Plan of Care (Signed)
  Problem: Health Behavior/Discharge Planning: Goal: Ability to manage health-related needs [name] improve Outcome: Progressing   Problem: Nutrition: Goal: Adequate nutrition [name] be maintained Outcome: Progressing   

## 2023-12-23 DIAGNOSIS — F1190 Opioid use, unspecified, uncomplicated: Secondary | ICD-10-CM | POA: Diagnosis not present

## 2023-12-23 DIAGNOSIS — R918 Other nonspecific abnormal finding of lung field: Secondary | ICD-10-CM | POA: Diagnosis not present

## 2023-12-23 DIAGNOSIS — M899 Disorder of bone, unspecified: Secondary | ICD-10-CM | POA: Diagnosis not present

## 2023-12-23 LAB — QUANTIFERON-TB GOLD PLUS (RQFGPL)
QuantiFERON Mitogen Value: >10 [IU]/mL
QuantiFERON Nil Value: 0.04 [IU]/mL
QuantiFERON TB1 Ag Value: 0.04 [IU]/mL
QuantiFERON TB2 Ag Value: 0.03 [IU]/mL

## 2023-12-23 LAB — QUANTIFERON-TB GOLD PLUS: QuantiFERON-TB Gold Plus: NEGATIVE

## 2023-12-23 LAB — HCV RNA QUANT
HCV Quantitative Log: 5.318 {Log} (ref >1.70)
HCV Quantitative: 208000 [IU]/mL (ref >50)

## 2023-12-23 LAB — CULTURE, BLOOD (ROUTINE X 2)

## 2023-12-23 LAB — RPR: RPR Ser Ql: NONREACTIVE

## 2023-12-23 MED ORDER — POLYETHYLENE GLYCOL 3350 17 G PO PACK
17.0000 g | PACK | Freq: Two times a day (BID) | ORAL | Status: DC
  Filled 2023-12-23: qty 1

## 2023-12-23 MED ORDER — SODIUM CHLORIDE 0.9 % IV SOLN: 2.0000 g | Freq: Three times a day (TID) | INTRAVENOUS | Status: AC

## 2023-12-23 MED ORDER — SENNOSIDES-DOCUSATE SODIUM 8.6-50 MG PO TABS
2.0000 | ORAL_TABLET | Freq: Two times a day (BID) | ORAL | Status: DC
  Administered 2023-12-23: 2 via ORAL
  Filled 2023-12-23 (×2): qty 2

## 2023-12-23 MED ORDER — OXYCODONE HCL 10 MG PO TABS: 10.0000 mg | ORAL_TABLET | ORAL | Status: AC | PRN

## 2023-12-23 MED ORDER — SODIUM CHLORIDE 0.9 % IV SOLN: 2.0000 g | Freq: Three times a day (TID) | INTRAVENOUS | Status: DC

## 2023-12-23 MED ORDER — CEFTAZIDIME 2 G IV SOLR
2.0000 g | Freq: Three times a day (TID) | INTRAVENOUS | Status: DC
  Filled 2023-12-23: qty 2

## 2023-12-23 MED ORDER — SODIUM CHLORIDE 0.9 % IV SOLN
2.0000 g | Freq: Three times a day (TID) | INTRAVENOUS | Status: DC
  Administered 2023-12-23: 2 g via INTRAVENOUS
  Filled 2023-12-23: qty 2

## 2023-12-23 MED ORDER — FLEET ENEMA RE ENEM: 1.0000 | ENEMA | Freq: Every day | RECTAL | Status: DC | PRN

## 2023-12-23 NOTE — Discharge Summary (Addendum)
Physician Discharge Summary   Patient: Ruben Turner MRN: [id number] DOB: [birth year]  Admit date:     12/19/2023  Discharge date: 12/23/23  Discharge Physician: Delfino Lovett   PCP: Patient, No Pcp Per   Recommendations at discharge:   [facility] for spine reconstruction  Discharge Diagnoses: Principal Problem:   Lytic bone lesions (T8 -10) Active Problems:   Pulmonary mass   Opioid use disorder   Pathologic thoracic fracture   Spinal stenosis, thoracic   Myelomalacia (HCC)   Spinal instabilities of thoracic region   Lytic lesion of bone on x-ray   Vertebral osteomyelitis [facility])  Hospital Course: Assessment and Plan:  48 year old male admitted for intractable back pain   1/15: CT aspiration/biopsy thoracic T9-10 interspace, neurosurgery consult 1/16: Waiting for transfer to [facility] -accepted by neurosurgery Dr. Manley Mason.  No beds available. 1/17: Onco & ID c/s - biopsy c/w abscess - started cefepime 1/18: Getting transferred to [facility].  Culture growing burkholderia cepacia antibiotic changed to ceftazidime.  He will need 12 weeks of IV antibiotics.  He will likely need debridement and removal of infected bone and reconstruction at [facility]   Intractable back pain - Discitis/Osteomyelitis MRI/CT of the lumbar spine showed presumed metastatic lesion in T8, T9, T10 with near complete collapse of T9 and 8 mm retropulsion of the posterior cortex resulting in moderate spinal canal stenosis Evaluated by neurosurgery and recommended IR guided biopsy Status post CT aspiration/biopsy of the thoracic T9-10 interspace by IR on 1/15 - patho c/w abscess, growing GNR. 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  He will need up to 12 weeks of antibiotics per ID. Pathology showing abscess Pt is being transferred to [facility] for spine stabilizing surgery - accepted by neurosurgery Dr. Manley Mason.  He may need debridement and removal of infected bone and reconstruction at [facility] Gram positive cocci  staph species in 1 of 4 bottle- likely contaminant, repeat blood culture negative Oncology Dr. Smith Robert seen. No malignancy on pathology/biopsy.  TLSO brace for stabilization for now   Pulmonary mass Initially seen on imaging but CT chest/abdomen/pelvis not confirming same. oncology Dr. Smith Robert seen. Outpt repeat CT in 6-8 weeks.   Opioid use disorder Patient reports active intravenous fentanyl use.  He shared with me that he did IV heroin about a week ago   - HCV antibody positive. Checking RNA-pending - Continue methadone -His pain control has been challenging -on methadone, oxycodone, fentanyl and Toradol.       Consultants: Neurosurgery, ID, Smith Robert Procedures performed: CT aspiration/biopsy of the thoracic T9-10 interspace by IR on 1/15  Disposition:  [facility] Diet recommendation:  Discharge Diet Orders (From admission, onward)     Start     Ordered   12/23/23 0000  Diet - low sodium heart healthy        12/23/23 1455           Carb modified diet DISCHARGE MEDICATION: Allergies as of 12/23/2023       Reactions   Erythromycin    Penicillins    Chest tightness        Medication List     STOP taking these medications    cyclobenzaprine 10  MG tablet Commonly known as: FLEXERIL   lidocaine 4 % Commonly known as: Lidocaine Pain Relief   sulfamethoxazole-trimethoprim 800-160 MG tablet Commonly known as: BACTRIM DS       TAKE these medications    cefTAZidime 2 g in sodium chloride 0.9 % 100 mL Inject 2 g into the vein every 8 (eight) hours.   methadone 10 MG tablet Commonly known as: DOLOPHINE Take 90 mg by mouth daily.   Oxycodone HCl 10  MG Tabs Take 1 tablet (10 mg total) by mouth every 4 (four) hours as needed for moderate pain (pain score 4-6).        Discharge Exam: Filed Weights   12/19/23 0548  Weight: 77.1 kg   General exam: Appears calm and comfortable  Respiratory system: Clear to auscultation. Respiratory effort normal. Cardiovascular system: S1 & S2 heard, RRR. No JVD, murmurs, rubs, gallops or clicks. No pedal edema. Gastrointestinal system: Abdomen is soft, benign Central nervous system: Alert and oriented. No focal neurological deficits. Extremities: Symmetric 5 x 5 power.  Has kyphotic deformity in the thoracic spine Skin: No rashes, lesions or ulcers Psychiatry: Judgement and insight appear normal. Mood & affect appropriate.   Condition at discharge: fair  The results of significant diagnostics from this hospitalization (including imaging, microbiology, ancillary and laboratory) are listed below for reference.   Imaging Studies: CT BONE TROCAR/NEEDLE BIOPSY DEEP Result Date: 12/20/2023 CLINICAL DATA:  Collapse of T9 with acute kyphosis T9-10, bony destruction of T9 with posterior displacement of fracture fragments EXAM: CT GUIDED DEEP  BONE ASPIRATION AND CORE BIOPSY TECHNIQUE: Patient was placed prone on the CT gantry and limited axial scans through the thorax were obtained. This exam was performed according to the departmental dose-optimization program which includes automated exposure control, adjustment of the mA and/or kV according to patient size and/or use of iterative reconstruction technique. Appropriate skin entry site was identified. Skin site was marked, prepped with chlorhexidine, draped in usual sterile fashion, and infiltrated locally with 1% lidocaine. Intravenous Fentanyl and Versed 1.5mg  were administered by RN during a total moderate (conscious) sedation time of 26 minutes; the patient's level of consciousness and physiological / cardiorespiratory status were monitored continuously  by radiology RN under my direct supervision. Under angled CT fluoroscopic guidance, initially a 22 gauge spinal needle was advanced into the T9-10 interspace from a right posterolateral approach in the region of bony destruction. A scant amount of bloody fluid was aspirated, sent for Gram stain and culture. Using the same approach, an 11-gauge Cook trocar bone needle was advanced into the T9-10 interspace posterior margin. Coaxial was obtained, also sent for Gram stain and culture. Subsequently, bone biopsy using the outer 11 gauge trocar needle was obtained, submitted to surgical pathology. Postprocedure scans show no hemorrhage or other apparent complication. No pneumothorax. Patient tolerated procedure well. COMPLICATIONS: COMPLICATIONS none IMPRESSION: 1. Technically successful CT guided thoracic spine T9-10 core and aspiration bone biopsy, sent for surgical pathology, Gram stain and culture. Electronically Signed   By: Corlis Leak M.D.   On: 12/20/2023 16:21   CT CHEST ABDOMEN PELVIS W CONTRAST Result Date: 12/19/2023 CLINICAL DATA:  Back pain.  Evaluation of metastatic disease. EXAM: CT CHEST, ABDOMEN, AND PELVIS WITH CONTRAST TECHNIQUE: Multidetector CT imaging of the chest, abdomen and pelvis was performed following the standard protocol during bolus administration of intravenous contrast. RADIATION DOSE REDUCTION: This exam was performed according to the departmental dose-optimization program which includes automated exposure control, adjustment of the mA and/or kV  according to patient size and/or use of iterative reconstruction technique. CONTRAST:  OMNIPAQUE IOHEXOL 300 MG/ML  SOLN COMPARISON:  Thoracic and lumbar spine CT and MRI dated 12/19/2023. FINDINGS: CT CHEST FINDINGS Cardiovascular: There is no cardiomegaly or pericardial effusion. The thoracic aorta is unremarkable. The origins of the great vessels of the aortic arch and the central pulmonary arteries appear patent. Mediastinum/Nodes: No  hilar or mediastinal adenopathy. The esophagus is grossly unremarkable. No mediastinal fluid collection. Lungs/Pleura: There is right infrahilar peribronchial thickening and airspace density in the right lower lobe concerning for bronchitis and pneumonia. An underlying mass is less likely but not excluded. Clinical correlation and follow-up to resolution recommended. There is a 6 mm subpleural nodule or para fissural lymph node along the left fissure. Additional nodules along the right major fissure measure up to 4 mm. No pleural effusion or pneumothorax. The central airways remain patent. Musculoskeletal: Degenerative changes of the shoulders. Minimally displaced fracture of the posterior right eighth rib. Nondisplaced fracture of the right T8 spinous process. Destructive changes and complete collapse of T9 with posterior buckling. See report of the CT and MRI of the spine for detailed evaluation. CT ABDOMEN PELVIS FINDINGS No intra-abdominal free air or free fluid. Hepatobiliary: Subcentimeter hepatic hypodense lesions are too small to characterize. No biliary dilatation. The gallbladder is unremarkable. Pancreas: The pancreas is grossly unremarkable as visualized. Spleen: Normal in size without focal abnormality. Adrenals/Urinary Tract: The adrenal glands are unremarkable. The kidneys, visualized ureters, and urinary bladder appear unremarkable. Stomach/Bowel: Evaluation of the bowel is limited in the absence of oral contrast and due to paucity of intra-abdominal fat. There is moderate amount of stool throughout the colon. There is no bowel obstruction or active inflammation. The appendix is not visualized with certainty. No inflammatory changes identified in the right lower quadrant. Vascular/Lymphatic: Mild atherosclerotic calcification of the abdominal aorta. The IVC is unremarkable. No portal venous gas. There is no adenopathy. Reproductive: The prostate and seminal vesicles are grossly unremarkable. No  pelvic mass. Other: There is loss of subcutaneous fat and cachexia. Musculoskeletal: Degenerative changes of the spine and multilevel disc desiccation and vacuum phenomena. No acute osseous pathology. IMPRESSION: 1. Right infrahilar peribronchial thickening and airspace density in the right lower lobe concerning for bronchitis and pneumonia. An underlying mass is less likely but not excluded. Clinical correlation and follow-up to resolution recommended. 2. Minimally displaced fracture of the posterior right eighth rib. Nondisplaced fracture of the right T8 spinous process. 3. Destructive changes and complete collapse of T9 with posterior buckling. See reports for the CT and MRI of the spine. 4. No acute intra-abdominal or pelvic pathology. 5. Moderate colonic stool burden. No bowel obstruction. 6.  Aortic Atherosclerosis (ICD10-I70.0). Electronically Signed   By: Elgie Collard M.D.   On: 12/19/2023 17:05   MR Cervical Spine W and Wo Contrast Result Date: 12/19/2023 CLINICAL DATA:  Bone mass or bone pain, pathologic fracture on same-day CT thoracic spine EXAM: MRI CERVICAL, THORACIC AND LUMBAR SPINE WITHOUT AND WITH CONTRAST TECHNIQUE: Multiplanar and multiecho pulse sequences of the cervical spine, to include the craniocervical junction and cervicothoracic junction, and thoracic and lumbar spine, were obtained without and with intravenous contrast. CONTRAST:  7mL GADAVIST GADOBUTROL 1 MMOL/ML IV SOLN COMPARISON:  08/09/2011 MRI lumbar spine, no prior MRI of the cervical or thoracic spine; correlation is made with CT thoracic and lumbar spine 12/19/2023; no prior CT of the cervical spine available. FINDINGS: Evaluation is somewhat limited by motion, particularly on the postcontrast  images. In addition, the patient could not complete the postcontrast imaging. No post-contrast axial could be obtained on the cervical and lumbar spine. MRI CERVICAL SPINE FINDINGS Alignment: No significant listhesis. Vertebrae: No  acute fracture, evidence of discitis, or suspicious osseous lesion. Cord: Normal in signal and morphology. Posterior Fossa, vertebral arteries, paraspinal tissues: Low lying cerebellar tonsils, which extend approximately 4 mm below the foramen magnum and do not meet criteria for Chiari 1 malformation. Normal vertebral artery flow voids. Disc levels: C2-C3: No significant disc bulge. No spinal canal stenosis or neuroforaminal narrowing. C3-C4: No significant disc bulge. Uncovertebral hypertrophy. No spinal canal stenosis. Mild right neural foraminal narrowing. C4-C5: Disc height loss and minimal disc bulge. Facet and uncovertebral hypertrophy. No spinal canal stenosis. Severe left and mild right neural foraminal narrowing. C5-C6: Disc height loss. Facet and uncovertebral hypertrophy. No spinal canal stenosis. Severe left and moderate right neural foraminal narrowing. C6-C7: Left paracentral disc protrusion. Facet arthropathy. No spinal canal stenosis or neural foraminal narrowing. C7-T1: No significant disc bulge. Facet arthropathy. No spinal canal stenosis. Mild right neural foraminal narrowing. MRI THORACIC SPINE FINDINGS Alignment: Focal kyphosis centered on the T8, T9, and T10 vertebral body fractures. Otherwise normal alignment. Vertebrae: Redemonstrated destruction of the majority of the T9 vertebral body, for which only a portion of the posterior cortex is seen. The posterior cortex extends approximately 8 mm into the spinal canal. Significant loss of the T8 vertebral body, with only 60% vertebral body height at the anterior aspect and 50% of the vertebral body present at the posterior aspect. At the superior aspect of T10, there is approximately 20% vertebral body height loss. Decreased T1 and increased T2 signal is seen throughout the T8, T9, and T10 vertebral bodies, with abnormal signal extending into posterior elements (series 19, image 5 and 17). Although postcontrast imaging is of limited utility, there  appears to be diffuse enhancement in this area (series 24, image 10). Similar abnormal signal in the left third rib (series 10, image 20), which also appears to enhance (series 24, image 28). Cord: Mild cord deformation at T9, without abnormal spinal cord signal. No abnormal enhancement. Paraspinal and other soft tissues: Masslike enhancement in the medial right lung, possibly at the right hilum (series 23, image 28). Disc levels: Moderate spinal canal stenosis posterior to T8-T9 through T9-T10, secondary to retropulsion (series 20, image 30). Severe bilateral neural foraminal narrowing at T8-T9 and T9-T10. MRI LUMBAR SPINE FINDINGS Segmentation:  5 lumbar type vertebral bodies. Alignment: Dextroscoliosis. Trace retrolisthesis of L1 on L2. Trace anterolisthesis of L4 on L5. Vertebrae: No acute fracture, evidence of discitis, or suspicious osseous lesion. Endplate degenerative changes, most prominent at L3-L4, eccentric to the left and at L5-S1, eccentric to the right. No abnormal enhancement. Congenitally short pedicles, which narrow the AP diameter of the spinal canal. Conus medullaris and cauda equina: Conus extends to the L2 level. Conus and cauda equina appear normal. Paraspinal and other soft tissues:  No acute finding. Disc levels: T12-L1: No significant disc bulge. No spinal canal stenosis or neural foraminal narrowing. L1-L2: No significant disc bulge. Mild facet arthropathy. Trace retrolisthesis. No spinal canal stenosis. Mild left neural foraminal narrowing. L2-L3: Minimal disc bulge. Mild facet arthropathy. No spinal canal stenosis or neural foraminal narrowing. L3-L4: Mild disc bulge. Mild-to-moderate facet arthropathy. Narrowing of the lateral recesses. Mild spinal canal stenosis. Moderate left and mild right neural foraminal narrowing. L4-L5: Trace anterolisthesis with disc unroofing and mild disc bulge. Severe facet arthropathy. Narrowing of the lateral  recesses. Mild-to-moderate spinal canal  stenosis. Mild bilateral neural foraminal narrowing. L5-S1: Minimal disc bulge with right eccentric disc osteophyte complex, which may contact the exiting right L5 nerve roots. Moderate right and mild left facet arthropathy. No spinal canal stenosis. Moderate right neural foraminal narrowing. IMPRESSION: 1. Evaluation is somewhat limited by motion and the patient could not complete the postcontrast imaging. No post-contrast axial could be obtained on the cervical and lumbar spine. 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 No cord signal abnormality. 2. Abnormal signal and enhancement in the left third rib. Findings are concerning for additional site of metastatic disease. 3. Masslike enhancement in the medial right lung, possibly at the right hilum, concerning for a primary neoplastic process or metastatic disease. Recommend further evaluation with a CT of the chest with contrast. 4. L3-L4 mild spinal canal stenosis with moderate left and mild right neural foraminal narrowing. Narrowing of the lateral recesses at this level could affect the descending L4 nerve roots. 5. L4-L5 mild-to-moderate spinal canal stenosis and mild bilateral neural foraminal narrowing. Narrowing of the lateral recesses at this level could affect the descending L5 nerve roots. 6. L5-S1 moderate right neural foraminal narrowing. 7. C4-C5 severe left and mild right neural foraminal narrowing. 8. C5-C6 severe left and moderate right neural foraminal narrowing. Electronically Signed   By: Wiliam Ke M.D.   On: 12/19/2023 14:24   MR THORACIC SPINE W WO CONTRAST Result Date: 12/19/2023 CLINICAL DATA:  Bone mass or bone pain, pathologic fracture on same-day CT thoracic spine EXAM: MRI CERVICAL, THORACIC AND LUMBAR SPINE WITHOUT AND WITH CONTRAST TECHNIQUE: Multiplanar and  multiecho pulse sequences of the cervical spine, to include the craniocervical junction and cervicothoracic junction, and thoracic and lumbar spine, were obtained without and with intravenous contrast. CONTRAST:  7mL GADAVIST GADOBUTROL 1 MMOL/ML IV SOLN COMPARISON:  08/09/2011 MRI lumbar spine, no prior MRI of the cervical or thoracic spine; correlation is made with CT thoracic and lumbar spine 12/19/2023; no prior CT of the cervical spine available. FINDINGS: Evaluation is somewhat limited by motion, particularly on the postcontrast images. In addition, the patient could not complete the postcontrast imaging. No post-contrast axial could be obtained on the cervical and lumbar spine. MRI CERVICAL SPINE FINDINGS Alignment: No significant listhesis. Vertebrae: No acute fracture, evidence of discitis, or suspicious osseous lesion. Cord: Normal in signal and morphology. Posterior Fossa, vertebral arteries, paraspinal tissues: Low lying cerebellar tonsils, which extend approximately 4 mm below the foramen magnum and do not meet criteria for Chiari 1 malformation. Normal vertebral artery flow voids. Disc levels: C2-C3: No significant disc bulge. No spinal canal stenosis or neuroforaminal narrowing. C3-C4: No significant disc bulge. Uncovertebral hypertrophy. No spinal canal stenosis. Mild right neural foraminal narrowing. C4-C5: Disc height loss and minimal disc bulge. Facet and uncovertebral hypertrophy. No spinal canal stenosis. Severe left and mild right neural foraminal narrowing. C5-C6: Disc height loss. Facet and uncovertebral hypertrophy. No spinal canal stenosis. Severe left and moderate right neural foraminal narrowing. C6-C7: Left paracentral disc protrusion. Facet arthropathy. No spinal canal stenosis or neural foraminal narrowing. C7-T1: No significant disc bulge. Facet arthropathy. No spinal canal stenosis. Mild right neural foraminal narrowing. MRI THORACIC SPINE FINDINGS Alignment: Focal kyphosis centered  on the T8, T9, and T10 vertebral body fractures. Otherwise normal alignment. Vertebrae: Redemonstrated destruction of the majority of the T9 vertebral body,  for which only a portion of the posterior cortex is seen. The posterior cortex extends approximately 8 mm into the spinal canal. Significant loss of the T8 vertebral body, with only 60% vertebral body height at the anterior aspect and 50% of the vertebral body present at the posterior aspect. At the superior aspect of T10, there is approximately 20% vertebral body height loss. Decreased T1 and increased T2 signal is seen throughout the T8, T9, and T10 vertebral bodies, with abnormal signal extending into posterior elements (series 19, image 5 and 17). Although postcontrast imaging is of limited utility, there appears to be diffuse enhancement in this area (series 24, image 10). Similar abnormal signal in the left third rib (series 10, image 20), which also appears to enhance (series 24, image 28). Cord: Mild cord deformation at T9, without abnormal spinal cord signal. No abnormal enhancement. Paraspinal and other soft tissues: Masslike enhancement in the medial right lung, possibly at the right hilum (series 23, image 28). Disc levels: Moderate spinal canal stenosis posterior to T8-T9 through T9-T10, secondary to retropulsion (series 20, image 30). Severe bilateral neural foraminal narrowing at T8-T9 and T9-T10. MRI LUMBAR SPINE FINDINGS Segmentation:  5 lumbar type vertebral bodies. Alignment: Dextroscoliosis. Trace retrolisthesis of L1 on L2. Trace anterolisthesis of L4 on L5. Vertebrae: No acute fracture, evidence of discitis, or suspicious osseous lesion. Endplate degenerative changes, most prominent at L3-L4, eccentric to the left and at L5-S1, eccentric to the right. No abnormal enhancement. Congenitally short pedicles, which narrow the AP diameter of the spinal canal. Conus medullaris and cauda equina: Conus extends to the L2 level. Conus and cauda equina  appear normal. Paraspinal and other soft tissues:  No acute finding. Disc levels: T12-L1: No significant disc bulge. No spinal canal stenosis or neural foraminal narrowing. L1-L2: No significant disc bulge. Mild facet arthropathy. Trace retrolisthesis. No spinal canal stenosis. Mild left neural foraminal narrowing. L2-L3: Minimal disc bulge. Mild facet arthropathy. No spinal canal stenosis or neural foraminal narrowing. L3-L4: Mild disc bulge. Mild-to-moderate facet arthropathy. Narrowing of the lateral recesses. Mild spinal canal stenosis. Moderate left and mild right neural foraminal narrowing. L4-L5: Trace anterolisthesis with disc unroofing and mild disc bulge. Severe facet arthropathy. Narrowing of the lateral recesses. Mild-to-moderate spinal canal stenosis. Mild bilateral neural foraminal narrowing. L5-S1: Minimal disc bulge with right eccentric disc osteophyte complex, which may contact the exiting right L5 nerve roots. Moderate right and mild left facet arthropathy. No spinal canal stenosis. Moderate right neural foraminal narrowing. IMPRESSION: 1. Evaluation is somewhat limited by motion and the patient could not complete the postcontrast imaging. No post-contrast axial could be obtained on the cervical and lumbar spine. 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 No cord signal abnormality. 2. Abnormal signal and enhancement in the left third rib. Findings are concerning for additional site of metastatic disease. 3. Masslike enhancement in the medial right lung, possibly at the right hilum, concerning for a primary neoplastic process or metastatic disease. Recommend further evaluation with a CT of the chest with contrast. 4. L3-L4 mild spinal canal stenosis with moderate left and mild right neural foraminal narrowing. Narrowing of the  lateral recesses at this level could affect the descending L4 nerve roots. 5. L4-L5 mild-to-moderate spinal canal stenosis and mild bilateral neural foraminal narrowing. Narrowing of the lateral recesses at this level could affect the descending L5  nerve roots. 6. L5-S1 moderate right neural foraminal narrowing. 7. C4-C5 severe left and mild right neural foraminal narrowing. 8. C5-C6 severe left and moderate right neural foraminal narrowing. Electronically Signed   By: Wiliam Ke M.D.   On: 12/19/2023 14:24   MR Lumbar Spine W Wo Contrast Result Date: 12/19/2023 CLINICAL DATA:  Bone mass or bone pain, pathologic fracture on same-day CT thoracic spine EXAM: MRI CERVICAL, THORACIC AND LUMBAR SPINE WITHOUT AND WITH CONTRAST TECHNIQUE: Multiplanar and multiecho pulse sequences of the cervical spine, to include the craniocervical junction and cervicothoracic junction, and thoracic and lumbar spine, were obtained without and with intravenous contrast. CONTRAST:  7mL GADAVIST GADOBUTROL 1 MMOL/ML IV SOLN COMPARISON:  08/09/2011 MRI lumbar spine, no prior MRI of the cervical or thoracic spine; correlation is made with CT thoracic and lumbar spine 12/19/2023; no prior CT of the cervical spine available. FINDINGS: Evaluation is somewhat limited by motion, particularly on the postcontrast images. In addition, the patient could not complete the postcontrast imaging. No post-contrast axial could be obtained on the cervical and lumbar spine. MRI CERVICAL SPINE FINDINGS Alignment: No significant listhesis. Vertebrae: No acute fracture, evidence of discitis, or suspicious osseous lesion. Cord: Normal in signal and morphology. Posterior Fossa, vertebral arteries, paraspinal tissues: Low lying cerebellar tonsils, which extend approximately 4 mm below the foramen magnum and do not meet criteria for Chiari 1 malformation. Normal vertebral artery flow voids. Disc levels: C2-C3: No significant disc bulge. No spinal canal stenosis  or neuroforaminal narrowing. C3-C4: No significant disc bulge. Uncovertebral hypertrophy. No spinal canal stenosis. Mild right neural foraminal narrowing. C4-C5: Disc height loss and minimal disc bulge. Facet and uncovertebral hypertrophy. No spinal canal stenosis. Severe left and mild right neural foraminal narrowing. C5-C6: Disc height loss. Facet and uncovertebral hypertrophy. No spinal canal stenosis. Severe left and moderate right neural foraminal narrowing. C6-C7: Left paracentral disc protrusion. Facet arthropathy. No spinal canal stenosis or neural foraminal narrowing. C7-T1: No significant disc bulge. Facet arthropathy. No spinal canal stenosis. Mild right neural foraminal narrowing. MRI THORACIC SPINE FINDINGS Alignment: Focal kyphosis centered on the T8, T9, and T10 vertebral body fractures. Otherwise normal alignment. Vertebrae: Redemonstrated destruction of the majority of the T9 vertebral body, for which only a portion of the posterior cortex is seen. The posterior cortex extends approximately 8 mm into the spinal canal. Significant loss of the T8 vertebral body, with only 60% vertebral body height at the anterior aspect and 50% of the vertebral body present at the posterior aspect. At the superior aspect of T10, there is approximately 20% vertebral body height loss. Decreased T1 and increased T2 signal is seen throughout the T8, T9, and T10 vertebral bodies, with abnormal signal extending into posterior elements (series 19, image 5 and 17). Although postcontrast imaging is of limited utility, there appears to be diffuse enhancement in this area (series 24, image 10). Similar abnormal signal in the left third rib (series 10, image 20), which also appears to enhance (series 24, image 28). Cord: Mild cord deformation at T9, without abnormal spinal cord signal. No abnormal enhancement. Paraspinal and other soft tissues: Masslike enhancement in the medial right lung, possibly at the right hilum (series 23,  image 28). Disc levels: Moderate spinal canal stenosis posterior to T8-T9 through T9-T10, secondary to retropulsion (series 20, image 30). Severe bilateral neural foraminal narrowing at T8-T9 and T9-T10. MRI LUMBAR SPINE FINDINGS Segmentation:  5 lumbar type vertebral bodies. Alignment: Dextroscoliosis. Trace retrolisthesis of L1 on L2. Trace anterolisthesis of  L4 on L5. Vertebrae: No acute fracture, evidence of discitis, or suspicious osseous lesion. Endplate degenerative changes, most prominent at L3-L4, eccentric to the left and at L5-S1, eccentric to the right. No abnormal enhancement. Congenitally short pedicles, which narrow the AP diameter of the spinal canal. Conus medullaris and cauda equina: Conus extends to the L2 level. Conus and cauda equina appear normal. Paraspinal and other soft tissues:  No acute finding. Disc levels: T12-L1: No significant disc bulge. No spinal canal stenosis or neural foraminal narrowing. L1-L2: No significant disc bulge. Mild facet arthropathy. Trace retrolisthesis. No spinal canal stenosis. Mild left neural foraminal narrowing. L2-L3: Minimal disc bulge. Mild facet arthropathy. No spinal canal stenosis or neural foraminal narrowing. L3-L4: Mild disc bulge. Mild-to-moderate facet arthropathy. Narrowing of the lateral recesses. Mild spinal canal stenosis. Moderate left and mild right neural foraminal narrowing. L4-L5: Trace anterolisthesis with disc unroofing and mild disc bulge. Severe facet arthropathy. Narrowing of the lateral recesses. Mild-to-moderate spinal canal stenosis. Mild bilateral neural foraminal narrowing. L5-S1: Minimal disc bulge with right eccentric disc osteophyte complex, which may contact the exiting right L5 nerve roots. Moderate right and mild left facet arthropathy. No spinal canal stenosis. Moderate right neural foraminal narrowing. IMPRESSION: 1. Evaluation is somewhat limited by motion and the patient could not complete the postcontrast imaging. No  post-contrast axial could be obtained on the cervical and lumbar spine. 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 No cord signal abnormality. 2. Abnormal signal and enhancement in the left third rib. Findings are concerning for additional site of metastatic disease. 3. Masslike enhancement in the medial right lung, possibly at the right hilum, concerning for a primary neoplastic process or metastatic disease. Recommend further evaluation with a CT of the chest with contrast. 4. L3-L4 mild spinal canal stenosis with moderate left and mild right neural foraminal narrowing. Narrowing of the lateral recesses at this level could affect the descending L4 nerve roots. 5. L4-L5 mild-to-moderate spinal canal stenosis and mild bilateral neural foraminal narrowing. Narrowing of the lateral recesses at this level could affect the descending L5 nerve roots. 6. L5-S1 moderate right neural foraminal narrowing. 7. C4-C5 severe left and mild right neural foraminal narrowing. 8. C5-C6 severe left and moderate right neural foraminal narrowing. Electronically Signed   By: Wiliam Ke M.D.   On: 12/19/2023 14:24   CT Lumbar Spine Wo Contrast Result Date: 12/19/2023 CLINICAL DATA:  Low back pain. Cancer suspected. Pain over the last 2 months. EXAM: CT LUMBAR SPINE WITHOUT CONTRAST TECHNIQUE: Multidetector CT imaging of the lumbar spine was performed without intravenous contrast administration. Multiplanar CT image reconstructions were also generated. RADIATION DOSE REDUCTION: This exam was performed according to the departmental dose-optimization program which includes automated exposure control, adjustment of the mA and/or kV according to patient size and/or use of iterative reconstruction technique. COMPARISON:  Thoracic CT same day FINDINGS: Segmentation: 5  lumbar type vertebral bodies. Alignment: Early Od a curvature convex to the right with the apex at L3. Degenerative anterolisthesis at L4-5 of 4 mm. Vertebrae: Degenerative endplate changes on the left at L3-4 and on the right at L4-5 and L5-S1 with sclerotic and early cystic changes. Cystic arthropathy present affecting the facet joints at L2-3, L3-4, L4-5 and L5-S1 as below. Paraspinal and other soft tissues: Negative. No evidence of adenopathy or mass lesion. Disc levels: T12-L1: Normal interspace.  L1-2: Mild bulging of the disc more towards the left. No compressive stenosis. L2-3: Disc degeneration more pronounced on the left. Mild bulging of the disc. No compressive stenosis. Mild facet arthropathy on the left. L3-4: Disc degeneration more pronounced on the left with disc space narrowing, sclerotic endplate changes and underlying cystic endplate changes. Circumferential bulging of the disc. Bilateral facet arthropathy with cystic changes. Moderate multifactorial spinal stenosis at this level that could be symptomatic, particularly on the left. L4-5: Facet arthropathy with cystic changes, worse on the right. Anterolisthesis of 4 mm. Disc degeneration more pronounced on the right with sclerotic and cystic endplate changes. Stenosis of the canal and lateral recesses that could cause neural compression, more likely on the right. L5-S1: Disc degeneration more pronounced on the right with vacuum phenomenon, endplate sclerotic changes and cystic changes. Facet arthropathy on the right with cystic changes. No central canal stenosis. Right foraminal stenosis that could affect the exiting right L5 nerve. IMPRESSION: 1. No evidence of malignancy. 2. Scoliosis convex to the right. 3. L3-4: Disc degeneration more pronounced on the left with disc space narrowing, sclerotic endplate changes and underlying cystic endplate changes. Circumferential bulging of the disc. Bilateral facet arthropathy with cystic changes. Moderate  multifactorial spinal stenosis at this level that could be symptomatic, particularly on the left. 4. L4-5: Facet arthropathy with cystic changes, worse on the right. Anterolisthesis of 4 mm. Disc degeneration more pronounced on the right with sclerotic and cystic endplate changes. Stenosis of the canal and lateral recesses that could cause neural compression, more likely on the right. 5. L5-S1: Disc degeneration more pronounced on the right with vacuum phenomenon, endplate sclerotic changes and cystic changes. Facet arthropathy on the right with cystic changes. Right foraminal stenosis that could affect the exiting right L5 nerve. 6. The appearance of the endplate degenerative changes and the facet arthropathy is unusual for a person of this age, with prominent cystic changes. Findings could be seen with a systemic arthropathy such as CPPD or gout. The findings in this region are not specifically suggestive of tuberculosis of the spine as was suggested in the thoracic region. Electronically Signed   By: Paulina Fusi M.D.   On: 12/19/2023 11:37   CT Thoracic Spine Wo Contrast Result Date: 12/19/2023 CLINICAL DATA:  Bone lesion, thoracic spine, incidental. Back pain over the last 2 months. EXAM: CT THORACIC SPINE WITHOUT CONTRAST TECHNIQUE: Multidetector CT images of the thoracic were obtained using the standard protocol without intravenous contrast. RADIATION DOSE REDUCTION: This exam was performed according to the departmental dose-optimization program which includes automated exposure control, adjustment of the mA and/or kV according to patient size and/or use of iterative reconstruction technique. COMPARISON:  Lumbar MRI 08/09/2011 FINDINGS: Alignment: Kyphosis of the thoracic spine with the apex at T9-10, 55 degrees by measurement. Vertebrae: Lytic destruction of the T9 vertebral body. Posterior bowing of the posterior margin of the vertebral body. Lytic destruction of the inferior aspect of the T8 vertebral  body in the superior aspect of the T10 vertebral body. There is an acute fracture of the right posterior eighth rib. There is a fracture of the transverse process on the right at T8. Paraspinal and other soft tissues: Some inflammatory changes of the posterior mediastinal soft tissues in the region from T8-T10. I do not identify definite soft tissue canal compromise, but MRI of the thoracic spine is recommended with and without contrast. Patient has obstruction/occlusion of the right middle lobe and lower lobe bronchi with volume loss of  the lung in those regions. Disc levels: No significant abnormality seen from T1 through T7 and from the T10-11 disc level to the lumbar region. IMPRESSION: 1. Lytic destruction of the T9 vertebral body with posterior bowing of the posterior margin of the vertebral body. Lytic destruction of the inferior aspect of the T8 vertebral body and the superior aspect of the T10 vertebral body. Acute fracture of the right posterior eighth rib. Fracture of the transverse process on the right at T8. Some surrounding soft tissue inflammatory change within the posterior mediastinum but no evidence of low-density abscess formation. Lung disease affecting the right middle and lower lobes. 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 This is a rare diagnosis and the differential diagnosis would also include metastatic disease and myeloma/plasmacytoma. Pyogenic spinal infection is less likely given the absence of an elevated white count and the absence of more pronounced surrounding soft tissue changes. Electronically Signed   By: Paulina Fusi M.D.   On: 12/19/2023 11:24    Microbiology: Results for orders placed or performed during the hospital encounter of 12/19/23  Blood culture (routine x 2)     Status: None (Preliminary result)   Collection Time: 12/19/23 12:20 PM   Specimen: BLOOD  Result Value Ref Range  Status   Specimen Description BLOOD BLOOD LEFT FOREARM  Final   Special Requests   Final    BOTTLES DRAWN AEROBIC AND ANAEROBIC Blood Culture results may not be optimal due to an inadequate volume of blood received in culture bottles   Culture   Final    NO GROWTH 4 DAYS Performed at [facility], [address], [place], [place] [address]    Report Status PENDING  Incomplete  Blood culture (routine x 2)     Status: Abnormal   Collection Time: 12/19/23 12:30 PM   Specimen: BLOOD RIGHT HAND  Result Value Ref Range Status   Specimen Description   Final    BLOOD RIGHT HAND Performed at [facility], [address] [place], [place] [address]    Special Requests   Final    BOTTLES DRAWN AEROBIC AND ANAEROBIC Blood Culture results may not be optimal due to an inadequate volume of blood received in culture bottles Performed at [facility], [address] [place], [place] [address]    Culture  Setup Time   Final    GRAM POSITIVE COCCI ANAEROBIC BOTTLE ONLY CRITICAL RESULT CALLED TO, READ BACK BY AND VERIFIED WITH: WILL ANDERSON PHARMD 1158 12/20/23 HNM    Culture (A)  Final    COAGULASE NEGATIVE STAPHYLOCOCCUS THE SIGNIFICANCE OF ISOLATING THIS ORGANISM FROM A SINGLE SET OF BLOOD CULTURES WHEN MULTIPLE SETS ARE DRAWN IS UNCERTAIN. PLEASE NOTIFY THE MICROBIOLOGY DEPARTMENT WITHIN ONE WEEK IF SPECIATION AND SENSITIVITIES ARE REQUIRED. Performed at [facility], [address], [place], [place] [address]    Report Status 12/23/2023 FINAL  Final  Blood Culture ID Panel (Reflexed)     Status: Abnormal   Collection Time: 12/19/23 12:30 PM  Result Value Ref Range Status   Enterococcus faecalis NOT DETECTED NOT DETECTED Final   Enterococcus Faecium NOT DETECTED NOT DETECTED Final   Listeria monocytogenes NOT DETECTED NOT DETECTED Final   Staphylococcus species DETECTED (A) NOT DETECTED Final    Comment: CRITICAL RESULT CALLED TO, READ BACK BY AND VERIFIED  WITH: Terie Purser [facility] 1158 12/20/23 HNM  Staphylococcus aureus (BCID) NOT DETECTED NOT DETECTED Final   Staphylococcus epidermidis NOT DETECTED NOT DETECTED Final   Staphylococcus lugdunensis NOT DETECTED NOT DETECTED Final   Streptococcus species NOT DETECTED NOT DETECTED Final   Streptococcus agalactiae NOT DETECTED NOT DETECTED Final   Streptococcus pneumoniae NOT DETECTED NOT DETECTED Final   Streptococcus pyogenes NOT DETECTED NOT DETECTED Final   A.calcoaceticus-baumannii NOT DETECTED NOT DETECTED Final   Bacteroides fragilis NOT DETECTED NOT DETECTED Final   Enterobacterales NOT DETECTED NOT DETECTED Final   Enterobacter cloacae complex NOT DETECTED NOT DETECTED Final   Escherichia coli NOT DETECTED NOT DETECTED Final   Klebsiella aerogenes NOT DETECTED NOT DETECTED Final   Klebsiella oxytoca NOT DETECTED NOT DETECTED Final   Klebsiella pneumoniae NOT DETECTED NOT DETECTED Final   Proteus species NOT DETECTED NOT DETECTED Final   Salmonella species NOT DETECTED NOT DETECTED Final   Serratia marcescens NOT DETECTED NOT DETECTED Final   Haemophilus influenzae NOT DETECTED NOT DETECTED Final   Neisseria meningitidis NOT DETECTED NOT DETECTED Final   Pseudomonas aeruginosa NOT DETECTED NOT DETECTED Final   Stenotrophomonas maltophilia NOT DETECTED NOT DETECTED Final   Candida albicans NOT DETECTED NOT DETECTED Final   Candida auris NOT DETECTED NOT DETECTED Final   Candida glabrata NOT DETECTED NOT DETECTED Final   Candida krusei NOT DETECTED NOT DETECTED Final   Candida parapsilosis NOT DETECTED NOT DETECTED Final   Candida tropicalis NOT DETECTED NOT DETECTED Final   Cryptococcus neoformans/gattii NOT DETECTED NOT DETECTED Final    Comment: Performed at [facility], 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, [place], [place] [address]    * FEW BURKHOLDERIA CEPACIA  Culture, blood (Routine X 2) w Reflex to ID Panel     Status: None (Preliminary result)   Collection Time: 12/21/23  5:23 PM   Specimen: BLOOD  Result Value Ref Range Status   Specimen Description BLOOD BLOOD LEFT ARM  Final   Special Requests   Final    BOTTLES DRAWN AEROBIC ONLY Blood Culture adequate volume   Culture   Final    NO GROWTH 2 DAYS Performed at [facility], [address], [place], [place] [address]    Report Status PENDING  Incomplete  Culture, blood (Routine X 2) w Reflex to ID Panel     Status: None (Preliminary result)   Collection Time: 12/21/23  5:23 PM   Specimen: BLOOD  Result Value Ref Range Status   Specimen  Description BLOOD BLOOD LEFT HAND  Final   Special Requests   Final    BOTTLES DRAWN AEROBIC AND ANAEROBIC Blood Culture adequate volume   Culture   Final    NO GROWTH 2 DAYS Performed at [facility], [address] [place], [place] [address]    Report Status PENDING  Incomplete    Labs: CBC: Recent Labs  Lab 12/18/23 0926 12/19/23 1220 12/20/23 0625 12/21/23 0509  WBC 9.3 9.3 9.6 8.9  NEUTROABS  --  6.5 7.8*  --   HGB 10.4* 10.1* 11.3* 10.5*  HCT  32.5* 31.6* 34.1* 31.3*  MCV 86.7 86.1 82.6 82.4  PLT 289 292 339 335   Basic Metabolic Panel: Recent Labs  Lab 12/18/23 0926 12/19/23 1220 12/20/23 0625 12/21/23 0509  NA 136 136 132* 133*  K 3.8 3.9 3.8 3.6  CL 102 99 97* 101  CO2 23 23 23 23   GLUCOSE 114* 75 97 146*  BUN 29* 14 11 16   CREATININE 0.84 0.71 0.63 0.67  CALCIUM 8.7* 8.8* 8.8* 8.8*   Liver Function Tests: Recent Labs  Lab 12/18/23 0926 12/19/23 1220  AST 23 21  ALT 19 16  ALKPHOS 62 65  BILITOT 0.7 0.7  PROT 7.4 7.1  ALBUMIN 3.3* 3.2*   CBG: No results for input(s): "GLUCAP" in the last 168 hours.  Discharge time spent: greater than 30 minutes.  Signed: Delfino Lovett, MD [facility] 12/23/2023

## 2023-12-23 NOTE — Progress Notes (Signed)
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

## 2023-12-23 NOTE — Plan of Care (Signed)
  Problem: Education: Goal: Knowledge of General Education information will improve Description: Including pain rating scale, medication(s)/side effects and non-pharmacologic comfort measures 12/23/2023 1919 by Mathis Fare, RN Outcome: Adequate for Discharge 12/23/2023 1738 by Mathis Fare, RN Outcome: Progressing   Problem: Health Behavior/Discharge Planning: Goal: Ability to manage health-related needs will improve 12/23/2023 1919 by Mathis Fare, RN Outcome: Adequate for Discharge 12/23/2023 1738 by Mathis Fare, RN Outcome: Progressing   Problem: Clinical Measurements: Goal: Ability to maintain clinical measurements within normal limits will improve 12/23/2023 1919 by Mathis Fare, RN Outcome: Adequate for Discharge 12/23/2023 1738 by Mathis Fare, RN Outcome: Progressing Goal: Will remain free from infection 12/23/2023 1919 by Mathis Fare, RN Outcome: Adequate for Discharge 12/23/2023 1738 by Mathis Fare, RN Outcome: Progressing Goal: Diagnostic test results will improve 12/23/2023 1919 by Mathis Fare, RN Outcome: Adequate for Discharge 12/23/2023 1738 by Mathis Fare, RN Outcome: Progressing Goal: Respiratory complications will improve Outcome: Adequate for Discharge Goal: Cardiovascular complication will be avoided Outcome: Adequate for Discharge   Problem: Activity: Goal: Risk for activity intolerance will decrease Outcome: Adequate for Discharge   Problem: Nutrition: Goal: Adequate nutrition will be maintained Outcome: Adequate for Discharge   Problem: Coping: Goal: Level of anxiety will decrease Outcome: Adequate for Discharge   Problem: Elimination: Goal: Will not experience complications related to bowel motility Outcome: Adequate for Discharge Goal: Will not experience complications related to urinary retention Outcome: Adequate for Discharge   Problem: Pain Managment: Goal: General experience of comfort will  improve and/or be controlled Outcome: Adequate for Discharge   Problem: Safety: Goal: Ability to remain free from injury will improve Outcome: Adequate for Discharge   Problem: Skin Integrity: Goal: Risk for impaired skin integrity will decrease Outcome: Adequate for Discharge

## 2023-12-23 NOTE — Plan of Care (Signed)

## 2023-12-23 NOTE — Progress Notes (Signed)
Approximately 1530-- Current discharge orders in for patient to transfer to [facility]. Per [facility], Patient will be going to 6 Neuro Floor Room 6121. This RN was connected with receiving RN and provided report-- all questions answered at this time. Medical transport for pt still being established.   Approximately 1545-- This RN was connected by [facility] with [facility]. They stated that currently no trucks are available to transport patient at this time, but that pt will be "retriaged" tonight. Patient Transport stated it was likely pt could be transported this evening by [facility]. [facility] stated they will call this RN back after 1900 tonight with an update.  Sherryll Burger MD, Bre, Charge RN, and pt made aware.

## 2023-12-24 LAB — CULTURE, BLOOD (ROUTINE X 2): Culture: NO GROWTH

## 2023-12-25 LAB — AEROBIC/ANAEROBIC CULTURE W GRAM STAIN (SURGICAL/DEEP WOUND)

## 2023-12-26 LAB — CULTURE, BLOOD (ROUTINE X 2)
Culture: NO GROWTH
Culture: NO GROWTH
Special Requests: ADEQUATE
Special Requests: ADEQUATE

## 2023-12-27 LAB — MULTIPLE MYELOMA PANEL, SERUM
Albumin SerPl Elph-Mcnc: 3.3 g/dL (ref 2.9–4.4)
Albumin/Glob SerPl: 0.9 (ref 0.7–1.7)
Alpha 1: 0.4 g/dL (ref 0.0–0.4)
Alpha2 Glob SerPl Elph-Mcnc: 1 g/dL (ref 0.4–1.0)
B-Globulin SerPl Elph-Mcnc: 0.9 g/dL (ref 0.7–1.3)
Gamma Glob SerPl Elph-Mcnc: 1.6 g/dL (ref 0.4–1.8)
Globulin, Total: 3.9 g/dL (ref 2.2–3.9)
IgA: 209 mg/dL (ref 90–386)
IgG (Immunoglobin G), Serum: 1406 mg/dL (ref 603–1613)
IgM (Immunoglobulin M), Srm: 436 mg/dL — ABNORMAL HIGH (ref 20–172)
Total Protein ELP: 7.2 g/dL (ref 6.0–8.5)

## 2024-01-01 ENCOUNTER — Telehealth: Payer: Self-pay

## 2024-01-01 NOTE — Telephone Encounter (Signed)
Patient will be discharging today for [facility] and wanted to make MD aware of surgery 12/27/23. Vertebrae have been fused (cervical and lumbar area) and he is on antibiotics ceftazidine via picc line and will be discharging on these antibiotics [facility] home Care). All updates are also in Care everywhere. He will continue to follow up with [facility]. He just wanted to make MD aware and update.

## 2024-03-21 ENCOUNTER — Ambulatory Visit
Admission: RE | Admit: 2024-03-21 | Discharge: 2024-03-21 | Disposition: A | Discharge status: Home / Self Care | Payer: MEDICAID | Source: Ambulatory Visit | Attending: Oncology | Admitting: Oncology

## 2024-03-21 DIAGNOSIS — M8448XA Pathological fracture, other site, initial encounter for fracture: Secondary | ICD-10-CM | POA: Diagnosis present

## 2024-03-21 DIAGNOSIS — R918 Other nonspecific abnormal finding of lung field: Secondary | ICD-10-CM | POA: Diagnosis present

## 2024-04-05 ENCOUNTER — Inpatient Hospital Stay: Payer: MEDICAID | Admitting: Oncology

## 2024-04-10 ENCOUNTER — Other Ambulatory Visit: Payer: Self-pay

## 2024-04-10 ENCOUNTER — Inpatient Hospital Stay: Payer: MEDICAID | Attending: Oncology | Admitting: Oncology

## 2024-04-10 ENCOUNTER — Telehealth: Payer: Self-pay

## 2024-04-10 ENCOUNTER — Encounter: Payer: Self-pay | Admitting: Oncology

## 2024-04-10 VITALS — BP 104/73 | HR 76 | Temp 97.8°F | Resp 18 | Ht 70.5 in | Wt 135.9 lb

## 2024-04-10 DIAGNOSIS — R918 Other nonspecific abnormal finding of lung field: Secondary | ICD-10-CM

## 2024-04-10 DIAGNOSIS — F17210 Nicotine dependence, cigarettes, uncomplicated: Secondary | ICD-10-CM | POA: Diagnosis not present

## 2024-04-10 DIAGNOSIS — G9589 Other specified diseases of spinal cord: Secondary | ICD-10-CM

## 2024-04-10 DIAGNOSIS — R911 Solitary pulmonary nodule: Secondary | ICD-10-CM | POA: Insufficient documentation

## 2024-04-10 DIAGNOSIS — M8580 Other specified disorders of bone density and structure, unspecified site: Secondary | ICD-10-CM | POA: Insufficient documentation

## 2024-04-10 DIAGNOSIS — M8448XA Pathological fracture, other site, initial encounter for fracture: Secondary | ICD-10-CM

## 2024-04-10 NOTE — Progress Notes (Signed)
 Hematology/Oncology Consult note [facility]  [phone] [phone]  Patient Care Team: Patient, No Pcp Per as PCP - General (General Practice)   Name of the patient: Ruben Turner  [id number]  02/19/1976   Date of visit: 04/10/24  Diagnosis-right lung nodule  Chief complaint/ Reason for visit-discuss CT scan results and further management  Heme/Onc history: Patient is a 48 year old male with no significant past medical history who presented to the ER for symptoms of worsening back pain which has been ongoing for the last 2 months.  This pain was more severe than before and therefore he presented to the ER.  He has a history of IV drug use.  Patient had CT and MRI of the spine as well as CT chest abdomen and pelvis with contrast.   MRI showed lesions in T8-T9 and T10 with near complete collapse of T9 with 8 mm retropulsion of the posterior cortex enhancement of the left third rib possibly concerning for metastatic disease CT chest abdomen and pelvis with contrast showed right infrahilar peribronchial thickening in the right lower lobe concerning for bronchitis and pneumonia and underlying mass could not be excluded.  Patient underwent aspiration biopsy of this lesion in the T8-T9-T10 area which was consistent with an abscess and was placed on 6 weeks of IV antibiotics.  Interval history-patient reports that his back pain is better after receiving IV antibiotics but not back to his baseline.  He has back pain when he walks more than a block.  ECOG PS- 1 Pain scale- 3   Review of systems- Review of Systems  Constitutional:  Negative for chills, fever, malaise/fatigue and weight loss.  HENT:  Negative for congestion, ear discharge and nosebleeds.   Eyes:  Negative for blurred vision.  Respiratory:  Negative for cough, hemoptysis, sputum production, shortness of breath and wheezing.   Cardiovascular:  Negative for chest pain,  palpitations, orthopnea and claudication.  Gastrointestinal:  Negative for abdominal pain, blood in stool, constipation, diarrhea, heartburn, melena, nausea and vomiting.  Genitourinary:  Negative for dysuria, flank pain, frequency, hematuria and urgency.  Musculoskeletal:  Positive for back pain. Negative for joint pain and myalgias.  Skin:  Negative for rash.  Neurological:  Negative for dizziness, tingling, focal weakness, seizures, weakness and headaches.  Endo/Heme/Allergies:  Does not bruise/bleed easily.  Psychiatric/Behavioral:  Negative for depression and suicidal ideas. The patient does not have insomnia.       Allergies  Allergen Reactions   Erythromycin    Penicillins     Chest tightness      Past Medical History:  Diagnosis Date   Allergy      Past Surgical History:  Procedure Laterality Date   BACK SURGERY     KNEE SURGERY Left    SHOULDER ARTHROSCOPY Right     Social History   Socioeconomic History   Marital status: Single    Spouse name: Not on file   Number of children: Not on file   Years of education: Not on file   Highest education level: Not on file  Occupational History   Not on file  Tobacco Use   Smoking status: Every Day    Types: Cigarettes   Smokeless tobacco: Never  Vaping Use   Vaping status: Never Used  Substance and Sexual Activity   Alcohol use: No   Drug use: Never   Sexual activity: Yes  Other Topics Concern   Not on file  Social History Narrative  Not on file   Social Drivers of Health   Financial Resource Strain: Low Risk  (12/26/2023)   Received from [facility]   Overall Financial Resource Strain (CARDIA)    Difficulty of Paying Living Expenses: Not very hard  Food Insecurity: Food Insecurity Present (04/10/2024)   Hunger Vital Sign    Worried About Running Out of Food in the Last Year: Sometimes true    Ran Out of Food in the Last Year: Sometimes true  Transportation Needs: No Transportation Needs (04/10/2024)    PRAPARE - Administrator, Civil Service (Medical): No    Lack of Transportation (Non-Medical): No  Physical Activity: Not on file  Stress: Not on file  Social Connections: Not on file  Intimate Partner Violence: Not At Risk (04/10/2024)   Humiliation, Afraid, Rape, and Kick questionnaire    Fear of Current or Ex-Partner: No    Emotionally Abused: No    Physically Abused: No    Sexually Abused: No    Family History  Problem Relation Age of Onset   Atrial fibrillation Father    Obesity Brother      Current Outpatient Medications:    methadone  (DOLOPHINE ) 10 MG tablet, Take 90 mg by mouth daily., Disp: , Rfl:    cefTAZidime  2 g in sodium chloride  0.9 % 100 mL, Inject 2 g into the vein every 8 (eight) hours. (Patient not taking: Reported on 04/10/2024), Disp: , Rfl:    oxyCODONE  10 MG TABS, Take 1 tablet (10 mg total) by mouth every 4 (four) hours as needed for moderate pain (pain score 4-6). (Patient not taking: Reported on 04/10/2024), Disp: , Rfl:   Physical exam:  Vitals:   04/10/24 1124  BP: 104/73  Pulse: 76  Resp: 18  Temp: 97.8 F (36.6 C)  TempSrc: Tympanic  SpO2: 99%  Weight: 135 lb 14.4 oz (61.6 kg)  Height: 5' 10.5" (1.791 m)   Physical Exam Cardiovascular:     Rate and Rhythm: Normal rate and regular rhythm.     Heart sounds: Normal heart sounds.  Pulmonary:     Effort: Pulmonary effort is normal.     Breath sounds: Normal breath sounds.  Skin:    General: Skin is warm and dry.  Neurological:     Mental Status: He is alert and oriented to person, place, and time.      I have personally reviewed labs listed below:    Latest Ref Rng & Units 12/21/2023    5:09 AM  CMP  Glucose 70 - 99 mg/dL 161   BUN 6 - 20 mg/dL 16   Creatinine 0.96 - 1.24 mg/dL 0.45   Sodium 409 - 811 mmol/L 133   Potassium 3.5 - 5.1 mmol/L 3.6   Chloride 98 - 111 mmol/L 101   CO2 22 - 32 mmol/L 23   Calcium 8.9 - 10.3 mg/dL 8.8       Latest Ref Rng & Units 12/21/2023     5:09 AM  CBC  WBC 4.0 - 10.5 K/uL 8.9   Hemoglobin 13.0 - 17.0 g/dL 91.4   Hematocrit 78.2 - 52.0 % 31.3   Platelets 150 - 400 K/uL 335    I have personally reviewed Radiology images listed below: No images are attached to the encounter.  CT Chest Wo Contrast Result Date: 04/10/2024 CLINICAL DATA:  Hematologic malignancy, spine lesions concerning for malignancy. * Tracking Code: BO * EXAM: CT CHEST WITHOUT CONTRAST TECHNIQUE: Multidetector CT imaging of the  chest was performed following the standard protocol without IV contrast. RADIATION DOSE REDUCTION: This exam was performed according to the departmental dose-optimization program which includes automated exposure control, adjustment of the mA and/or kV according to patient size and/or use of iterative reconstruction technique. COMPARISON:  12/19/2023. FINDINGS: Cardiovascular: Heart size normal.  No pericardial effusion. Mediastinum/Nodes: No pathologically enlarged mediastinal or axillary lymph nodes. Hilar regions are difficult to definitively evaluate without IV contrast. Esophagus is grossly unremarkable. Lungs/Pleura: 4 mm anterolateral right lower lobe nodule (4/86), unchanged. Small juxtapleural nodules are considered benign. No pleural fluid. Minimal debris in the airway. Rudimentary lower right tracheal bronchus. Upper Abdomen: Visualized portions of the liver, gallbladder, adrenal glands, kidneys, spleen, pancreas, stomach and bowel are grossly unremarkable. No upper abdominal adenopathy. Musculoskeletal: Postoperative changes in the spine. Osteopenia. Degenerative changes in the shoulders. Lytic destruction of T9 and a portion of T8 with retropulsion of bone. There may be a lytic lesion in the superior endplate of L1. No additional worrisome lytic or sclerotic lesions. IMPRESSION: 1. 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 2. 4 mm anterolateral right lower lobe nodule, unchanged. Recommend  attention on follow-up. Electronically Signed   By: Shearon Denis M.D.   On: 04/10/2024 11:44     Assessment and plan- Patient is a 48 y.o. male who is here to discuss CT scan results and further management  Patient had a CT chest without contrast in April 2025 to follow-up on the findings of peribronchial thickening noted in January 2025.  CT does not show any evidence of enlarged mediastinal or hilar adenopathy.  4 mm right lower lobe nodule unchanged.  I will plan to follow-up on this with a repeat CT in 6 months time.  CT report spinal or shows metastatic disease in the T8-T9 area however this was confirmed to be abscess secondary to IV drug use.  We will reach out to radiology to addend their report   Visit Diagnosis 1. Lung nodule      Dr. Seretha Dance, MD, MPH [facility] [phone] 04/10/2024 12:48 PM               This

## 2024-04-10 NOTE — Telephone Encounter (Signed)
 Per Dr. Randy Buttery "Mercdes- he has RLL 4 mm nodule which we will follow up with ct chest without contrast in 6 months and I will see him 2 weeks after that. Please let him know".  Outbound call to patient; informed of above.  Patient has no additional questions / concerns at this time.

## 2024-04-11 ENCOUNTER — Telehealth: Payer: Self-pay

## 2024-04-11 NOTE — Telephone Encounter (Signed)
 Clinical Social Work was referred by medical provider for assessment of psychosocial needs.  CSW attempted to contact patient by phone.  Left voicemail with contact information and request for return call.

## 2024-04-22 ENCOUNTER — Telehealth: Payer: Self-pay

## 2024-04-22 NOTE — Telephone Encounter (Signed)
 Clinical Social Work was referred by medical provider for assessment of psychosocial needs.  CSW attempted to contact patient by phone a second time.  Left voicemail with contact information and request for return call.

## 2024-10-16 ENCOUNTER — Ambulatory Visit: Payer: MEDICAID | Attending: Oncology

## 2024-10-24 ENCOUNTER — Inpatient Hospital Stay: Payer: MEDICAID | Admitting: Oncology

## 2024-10-25 ENCOUNTER — Ambulatory Visit: Payer: MEDICAID | Admitting: Oncology
